# Patient Record
Sex: Male | Born: 1955 | Race: White | Hispanic: No | Marital: Married | Smoking: Never smoker
Health system: Southern US, Community
[De-identification: ages and names within clinical notes are randomized; demographics above are authoritative.]

## PROBLEM LIST (undated history)

## (undated) DIAGNOSIS — I10 Essential (primary) hypertension: Secondary | ICD-10-CM

## (undated) DIAGNOSIS — E039 Hypothyroidism, unspecified: Secondary | ICD-10-CM

## (undated) DIAGNOSIS — E785 Hyperlipidemia, unspecified: Secondary | ICD-10-CM

## (undated) HISTORY — PX: KNEE SURGERY: SHX244

## (undated) HISTORY — PX: CARPAL TUNNEL RELEASE: SHX101

## (undated) HISTORY — DX: Hyperlipidemia, unspecified: E78.5

## (undated) HISTORY — PX: ROTATOR CUFF REPAIR: SHX139

## (undated) HISTORY — DX: Hypothyroidism, unspecified: E03.9

## (undated) HISTORY — PX: ACHILLES TENDON LENGTHENING: SUR826

## (undated) HISTORY — PX: APPENDECTOMY: SHX54

## (undated) HISTORY — PX: HERNIA REPAIR: SHX51

## (undated) HISTORY — PX: HAND TENDON SURGERY: SHX663

---

## 2018-07-08 DIAGNOSIS — I10 Essential (primary) hypertension: Secondary | ICD-10-CM | POA: Insufficient documentation

## 2018-07-08 DIAGNOSIS — E7800 Pure hypercholesterolemia, unspecified: Secondary | ICD-10-CM | POA: Insufficient documentation

## 2019-11-26 DIAGNOSIS — U071 COVID-19: Secondary | ICD-10-CM

## 2019-11-26 HISTORY — DX: COVID-19: U07.1

## 2020-01-27 ENCOUNTER — Ambulatory Visit
Admission: EM | Admit: 2020-01-27 | Discharge: 2020-01-27 | Disposition: A | Discharge status: Home / Self Care | Payer: Medicare Other | Attending: Chiropractor | Admitting: Chiropractor

## 2020-01-27 ENCOUNTER — Ambulatory Visit (INDEPENDENT_AMBULATORY_CARE_PROVIDER_SITE_OTHER)
Admit: 2020-01-27 | Discharge: 2020-01-27 | Disposition: A | Discharge status: Home / Self Care | Payer: Medicare Other | Attending: Family Medicine | Admitting: Family Medicine

## 2020-01-27 ENCOUNTER — Other Ambulatory Visit: Payer: Self-pay

## 2020-01-27 ENCOUNTER — Encounter: Payer: Self-pay | Admitting: Emergency Medicine

## 2020-01-27 ENCOUNTER — Ambulatory Visit
Admission: RE | Admit: 2020-01-27 | Discharge: 2020-01-27 | Disposition: A | Discharge status: Home / Self Care | Payer: Medicare Other | Source: Ambulatory Visit | Attending: Family Medicine | Admitting: Family Medicine

## 2020-01-27 ENCOUNTER — Ambulatory Visit
Admission: EM | Admit: 2020-01-27 | Discharge: 2020-01-27 | Disposition: A | Discharge status: Home / Self Care | Payer: Medicare Other | Attending: Family Medicine | Admitting: Family Medicine

## 2020-01-27 DIAGNOSIS — E782 Mixed hyperlipidemia: Secondary | ICD-10-CM | POA: Diagnosis not present

## 2020-01-27 DIAGNOSIS — R200 Anesthesia of skin: Secondary | ICD-10-CM

## 2020-01-27 DIAGNOSIS — I10 Essential (primary) hypertension: Secondary | ICD-10-CM | POA: Diagnosis not present

## 2020-01-27 DIAGNOSIS — R202 Paresthesia of skin: Secondary | ICD-10-CM | POA: Diagnosis not present

## 2020-01-27 HISTORY — DX: Essential (primary) hypertension: I10

## 2020-01-27 LAB — CBC WITH DIFFERENTIAL/PLATELET
Abs Immature Granulocytes: 0.02 10*3/uL (ref 0.00–0.07)
Basophils Absolute: 0 10*3/uL (ref 0.0–0.1)
Basophils Relative: 1 %
Eosinophils Absolute: 0.2 10*3/uL (ref 0.0–0.5)
Eosinophils Relative: 3 %
HCT: 41.7 % (ref 39.0–52.0)
Hemoglobin: 14.7 g/dL (ref 13.0–17.0)
Immature Granulocytes: 0 %
Lymphocytes Relative: 34 %
Lymphs Abs: 2.3 10*3/uL (ref 0.7–4.0)
MCH: 32.7 pg (ref 26.0–34.0)
MCHC: 35.3 g/dL (ref 30.0–36.0)
MCV: 92.9 fL (ref 80.0–100.0)
Monocytes Absolute: 0.6 10*3/uL (ref 0.1–1.0)
Monocytes Relative: 9 %
Neutro Abs: 3.6 10*3/uL (ref 1.7–7.7)
Neutrophils Relative %: 53 %
Platelets: 260 10*3/uL (ref 150–400)
RBC: 4.49 MIL/uL (ref 4.22–5.81)
RDW: 12.4 % (ref 11.5–15.5)
WBC: 6.7 10*3/uL (ref 4.0–10.5)
nRBC: 0 % (ref 0.0–0.2)

## 2020-01-27 LAB — COMPREHENSIVE METABOLIC PANEL
ALT: 39 U/L (ref 0–44)
AST: 26 U/L (ref 15–41)
Albumin: 4.6 g/dL (ref 3.5–5.0)
Alkaline Phosphatase: 45 U/L (ref 38–126)
Anion gap: 12 (ref 5–15)
BUN: 23 mg/dL (ref 8–23)
CO2: 23 mmol/L (ref 22–32)
Calcium: 9.4 mg/dL (ref 8.9–10.3)
Chloride: 101 mmol/L (ref 98–111)
Creatinine, Ser: 0.92 mg/dL (ref 0.61–1.24)
GFR calc Af Amer: >60 mL/min (ref >60)
GFR calc non Af Amer: >60 mL/min (ref >60)
Glucose, Bld: 96 mg/dL (ref 70–99)
Potassium: 3.8 mmol/L (ref 3.5–5.1)
Sodium: 136 mmol/L (ref 135–145)
Total Bilirubin: 0.5 mg/dL (ref 0.3–1.2)
Total Protein: 8.3 g/dL — ABNORMAL HIGH (ref 6.5–8.1)

## 2020-01-27 LAB — LIPID PANEL
Cholesterol: 356 mg/dL — ABNORMAL HIGH (ref 0–200)
HDL: 38 mg/dL — ABNORMAL LOW (ref >40)
LDL Cholesterol: UNDETERMINED mg/dL (ref 0–99)
Total CHOL/HDL Ratio: 9.4 RATIO
Triglycerides: 535 mg/dL — ABNORMAL HIGH (ref <150)
VLDL: UNDETERMINED mg/dL (ref 0–40)

## 2020-01-27 MED ORDER — GEMFIBROZIL 600 MG PO TABS
600.0000 mg | ORAL_TABLET | Freq: Two times a day (BID) | ORAL | 1 refills | Status: DC
Start: 2020-01-27 — End: 2021-07-10

## 2020-01-27 MED ORDER — LISINOPRIL 20 MG PO TABS
20.0000 mg | ORAL_TABLET | Freq: Every day | ORAL | 1 refills | Status: DC
Start: 2020-01-27 — End: 2020-12-05

## 2020-01-27 MED ORDER — ASPIRIN EC 81 MG PO TBEC: 81.0000 mg | DELAYED_RELEASE_TABLET | Freq: Every day | ORAL | 3 refills | Status: DC

## 2020-01-27 MED ORDER — GADOBUTROL 1 MMOL/ML IV SOLN
8.0000 mL | Freq: Once | INTRAVENOUS | Status: AC | PRN
  Administered 2020-01-27: 8 mL via INTRAVENOUS

## 2020-01-27 MED ORDER — ATORVASTATIN CALCIUM 40 MG PO TABS
40.0000 mg | ORAL_TABLET | Freq: Every day | ORAL | 1 refills | Status: DC
Start: 2020-01-27 — End: 2020-12-09

## 2020-01-27 NOTE — ED Triage Notes (Signed)
Patient states he had hand surgery 3 weeks ago. He is c/o intermittent left arm numbness, face numbness and left leg numbness that started about 4 days ago. He was seen and evaluated at urgent care in [place] and was advised to go to the ER. He stated he did not want to wait in the ER so he came here.

## 2020-01-27 NOTE — ED Provider Notes (Addendum)
[facility]    CSN: [id number] Arrival date & time: 01/27/20  1412      History   Chief Complaint Chief Complaint  Patient presents with  . Numbness   HPI  64 year old male presents with the above complaint.  Patient reports that he has been experiencing intermittent numbness of the left side of his face, left arm, and left leg for the past 3 to 4 da ys.  Patient thought that this may be due to nerve block from recent surgery for the appearance contracture.  He saw his orthopedist today and was informed that this is not the case.  He denies any weakness.  No difficulty with speech.  No word finding difficulty.  No vision changes.  No headache.  No other reported symptoms.  No relieving factors.  Endorses compliance with his home medications.    Past Medical History:  Diagnosis Date  . COVID-19 11/2019  . Hypertension    Past Surgical History:  Procedure Laterality Date  . ACHILLES TENDON LENGTHENING    . HAND TENDON SURGERY    . HERNIA REPAIR    . KNEE SURGERY    . ROTATOR CUFF REPAIR     Home Medications    Prior to Admission medications   Medication Sig Start Date End Date Taking? Authorizing Provider  aspirin EC 81 MG tablet Take 1 tablet (81 mg total) by mouth daily. Swallow whole. 01/27/20   Tommie Sams, DO  atorvastatin (LIPITOR) 40 MG tablet Take 1 tablet (40 mg total) by mouth daily. 01/27/20   Tommie Sams, DO  gemfibrozil (LOPID) 600 MG tablet Take 1 tablet (600 mg total) by mouth 2 (two) times daily before a meal. 01/27/20   Cassius Cullinane G, DO  lisinopril (ZESTRIL) 20 MG tablet Take 1 tablet (20 mg total) by mouth daily. 01/27/20   Tommie Sams, DO   Social History Social History   Tobacco Use  . Smoking status: Never Smoker  . Smokeless tobacco: Never Used  Substance Use Topics  . Alcohol use: Yes  . Drug use: Never     Allergies   Patient has no known allergies.   Review of Systems Review of Systems  Eyes: Negative.   Neurological:  Positive for numbness. Negative for weakness.   Physical Exam Triage Vital Signs ED Triage Vitals  Enc Vitals Group     BP 01/27/20 1430 (!) 169/126     Pulse Rate 01/27/20 1430 79     Resp 01/27/20 1430 18     Temp 01/27/20 1430 98 F (36.7 C)     Temp Source 01/27/20 1430 Oral     SpO2 01/27/20 1430 98 %     Weight 01/27/20 1427 195 lb (88.5 kg)     Height 01/27/20 1427 5\' 8"  (1.727 m)     Head Circumference --      Peak Flow --      Pain Score 01/27/20 1427 0     Pain Loc --      Pain Edu? --      Excl. in GC? --    Updated Vital Signs BP (!) 169/126 (BP Location: Right Arm)   Pulse 79   Temp 98 F (36.7 C) (Oral)   Resp 18   Ht 5\' 8"  (1.727 m)   Wt 88.5 kg   SpO2 98%   BMI 29.65 kg/m   Visual Acuity Right Eye Distance:   Left Eye Distance:   Bilateral Distance:  Right Eye Near:   Left Eye Near:    Bilateral Near:     Physical Exam Vitals and nursing note reviewed.  Constitutional:      General: He is not in acute distress.    Appearance: Normal appearance. He is not ill-appearing.  HENT:     Head: Normocephalic and atraumatic.  Eyes:     General:        Right eye: No discharge.        Left eye: No discharge.     Conjunctiva/sclera: Conjunctivae normal.  Cardiovascular:     Rate and Rhythm: Normal rate and regular rhythm.     Heart sounds: No murmur heard.   Pulmonary:     Effort: Pulmonary effort is normal.     Breath sounds: Normal breath sounds. No wheezing, rhonchi or rales.  Neurological:     General: No focal deficit present.     Mental Status: He is alert and oriented to person, place, and time.     Cranial Nerves: No cranial nerve deficit.     Motor: No weakness.  Psychiatric:        Mood and Affect: Mood normal.        Behavior: Behavior normal.    UC Treatments / Results  Labs (all labs ordered are listed, but only abnormal results are displayed) Labs Reviewed  COMPREHENSIVE METABOLIC PANEL - Abnormal; Notable for the  following components:      Result Value   Total Protein 8.3 (*)    All other components within normal limits  CBC WITH DIFFERENTIAL/PLATELET  LIPID PANEL    EKG   Radiology CT Head Wo Contrast  Result Date: 01/27/2020 CLINICAL DATA:  64 year old who presents with intermittent LEFT UPPER EXTREMITY numbness, facial numbness and LEFT lower extremity numbness that began about 4 days ago. EXAM: CT HEAD WITHOUT CONTRAST TECHNIQUE: Contiguous axial images were obtained from the base of the skull through the vertex without intravenous contrast. COMPARISON:  None. FINDINGS: Brain: Slight head tilt in the gantry. Ventricular system normal in size and appearance for age. 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 No mass lesion. No midline shift. No acute hemorrhage or hematoma. No extra-axial fluid collections. No evidence of acute infarction. Vascular: Moderate BILATERAL carotid siphon and mild BILATERAL vertebral artery atherosclerosis. No hyperdense vessel. Skull: No skull fracture or other focal osseous abnormality involving the skull. Sinuses/Orbits: Visualized paranasal sinuses, bilateral mastoid air cells and bilateral middle ear cavities well-aerated. Visualized orbits and globes normal in appearance. Other: None. IMPRESSION: 1. No acute intracranial abnormality. 2. 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 Electronically Signed   By: Hulan Saas M.D.   On: 01/27/2020 15:32    Procedures Procedures (including critical care time)  Medications Ordered in UC Medications - No data to display  Initial Impression / Assessment and Plan / UC Course  I have reviewed the triage vital signs and the nursing notes.  Pertinent labs & imaging results that were available during my care of the patient were reviewed by me and considered in my medical decision making (see chart for details).    64 year old male presents with  left-sided numbness. Laboratory studies unremarkable. CT head was obtained and revealed no acute intracranial abnormalities. Patient was placed on aspirin. Sending for MRI for further evaluation.  Hypertension -currently uncontrolled. Patient states that this often happens due to whitecoat hypertension. Lisinopril refilled.  Hyperlipidemia -mixed. Unsure of control. I  have refilled atorvastatin and Lopid today. Awaiting a lipid panel.  Final Clinical Impressions(s) / UC Diagnoses   Final diagnoses:  Numbness  Essential hypertension  Mixed hyperlipidemia     Discharge Instructions     Labs normal.  No evidence of acute stroke on CT.  Medications as directed.     ED Prescriptions    Medication Sig Dispense Auth. Provider   aspirin EC 81 MG tablet Take 1 tablet (81 mg total) by mouth daily. Swallow whole. 90 tablet Jeromy Borcherding G, DO   atorvastatin (LIPITOR) 40 MG tablet Take 1 tablet (40 mg total) by mouth daily. 90 tablet Gyanna Jarema G, DO   gemfibrozil (LOPID) 600 MG tablet Take 1 tablet (600 mg total) by mouth 2 (two) times daily before a meal. 180 tablet Aprill Banko G, DO   lisinopril (ZESTRIL) 20 MG tablet Take 1 tablet (20 mg total) by mouth daily. 90 tablet Everlene Other G, DO     PDMP not reviewed this encounter.      Tommie Sams, [place] 01/27/20 1612

## 2020-01-27 NOTE — Discharge Instructions (Addendum)
I would recommend that you follow up in the ER for further evaluation and treatment  Possible CT and labs to help rule out TIA or stroke  Your blood pressure is elevated in the office today

## 2020-01-27 NOTE — ED Triage Notes (Signed)
Pt presents with numbness and tingling in the left side of the face, left arm and left leg for the past 3 days.Denies chest pain, dizziness, vision changes, trouble talking.   Pt reports he had surgery in the left hand 3 weeks ago, and he was thinking the numbness and tingling an be related to the anesthesia block. Pt went to the orthopedic surgeon today and they said is not related to the blockage.

## 2020-01-27 NOTE — ED Provider Notes (Signed)
Brad Flores    CSN: [id number] Arrival date & time: 01/27/20  1209      History   Chief Complaint Chief Complaint  Patient presents with  . Numbness    HPI Brad Flores is a 64 y.o. male.   Reports that he has been experiencing numbness and tingling of the L arm, L leg, and left side of his face. Reports that he feels all of the sensations together when they occur. Reports that this has been occurring intermittently for the last 3 days. Reports that he had surgery on his L hand about 3 weeks ago. Denies back pain, neck pain, arm pain, leg pain. He came directly from the surgeon's office and reports that he was told to be evaluated for his numbness. Denies headaches, weakness, fall, vision changes, ringing in the ears, changes in taste, other symptoms.   ROS per HPI  The history is provided by the patient.    Past Medical History:  Diagnosis Date  . Hypertension     There are no problems to display for this patient.   Past Surgical History:  Procedure Laterality Date  . ACHILLES TENDON LENGTHENING    . HAND TENDON SURGERY    . HERNIA REPAIR    . KNEE SURGERY    . ROTATOR CUFF REPAIR         Home Medications    Prior to Admission medications   Medication Sig Start Date End Date Taking? Authorizing Provider  atorvastatin (LIPITOR) 40 MG tablet Take by mouth. 12/31/17  Yes [provider]  gemfibrozil (LOPID) 600 MG tablet Take by mouth. 12/31/17  Yes [provider]  lisinopril (ZESTRIL) 20 MG tablet Take 1 tablet by mouth daily. 12/31/17  Yes [provider]    Family History History reviewed. No pertinent family history.  Social History Social History   Tobacco Use  . Smoking status: Never Smoker  . Smokeless tobacco: Never Used  Substance Use Topics  . Alcohol use: Yes  . Drug use: Never     Allergies   Patient has no known allergies.   Review of Systems Review of Systems   Physical Exam Triage Vital  Signs ED Triage Vitals  Enc Vitals Group     BP      Pulse      Resp      Temp      Temp src      SpO2      Weight      Height      Head Circumference      Peak Flow      Pain Score      Pain Loc      Pain Edu?      Excl. in GC?    No data found.  Updated Vital Signs BP (!) 156/106 (BP Location: Right Arm)   Pulse 70   Temp 98.8 F (37.1 C) (Oral)   Resp 18   SpO2 95%      Physical Exam Vitals and nursing note reviewed.  Constitutional:      General: He is not in acute distress.    Appearance: He is well-developed. He is not ill-appearing.  HENT:     Head: Normocephalic and atraumatic.  Eyes:     Extraocular Movements: Extraocular movements intact.     Conjunctiva/sclera: Conjunctivae normal.     Pupils: Pupils are equal, round, and reactive to light.  Cardiovascular:     Rate and Rhythm: Normal  rate and regular rhythm.     Heart sounds: Normal heart sounds. No murmur heard.   Pulmonary:     Effort: Pulmonary effort is normal. No respiratory distress.     Breath sounds: Normal breath sounds. No stridor. No wheezing, rhonchi or rales.  Chest:     Chest wall: No tenderness.  Abdominal:     Palpations: Abdomen is soft.     Tenderness: There is no abdominal tenderness.  Musculoskeletal:        General: Normal range of motion.     Cervical back: Normal range of motion and neck supple.  Skin:    General: Skin is warm and dry.     Capillary Refill: Capillary refill takes less than 2 seconds.     Comments: Sutures to the palm of the L hand from middle finger to wrist  Neurological:     General: No focal deficit present.     Mental Status: He is alert and oriented to person, place, and time.     Cranial Nerves: No cranial nerve deficit.     Sensory: No sensory deficit.     Motor: No weakness.     Coordination: Coordination normal.     Gait: Gait normal.     Deep Tendon Reflexes: Reflexes normal.  Psychiatric:        Mood and Affect: Mood normal.         Behavior: Behavior normal.        Thought Content: Thought content normal.      UC Treatments / Results  Labs (all labs ordered are listed, but only abnormal results are displayed) Labs Reviewed - No data to display  EKG ED ECG REPORT   Date: 01/27/2020  EKG Time: 1:33 PM  Rate: 72  Rhythm: normal sinus rhythm,  normal EKG, normal sinus rhythm, there are no previous tracings available for comparison  Axis:   Intervals:none  ST&T Change: none  Narrative Interpretation: I agree with interpretation, NSR, normal EKG      Radiology No results found.  Procedures Procedures (including critical care time)  Medications Ordered in UC Medications - No data to display  Initial Impression / Assessment and Plan / UC Course  I have reviewed the triage vital signs and the nursing notes.  Pertinent labs & imaging results that were available during my care of the patient were reviewed by me and considered in my medical decision making (see chart for details).     Numbness and tingling to L arm, leg and face  Discussed with pt that symptoms were likely not related to surgery or the nerve block that he received prior to surgery. Discussed that symptoms could be caused by a blood clot or stroke Given that he has recently had surgery, he is at a higher risk for stroke than he normally would be  Discussed that we cannot rule out stroke or blood clot here in the office No neuro deficits noted in office today Recommended that patient go to the ER for further evaluation and treatment Discussed that he did not want to sit in the ER for hours and hours for a CT Gave info to [facility] for possible evaluation there Patient agrees to go here for further evaluation and treatment in personal vehicle Final Clinical Impressions(s) / UC Diagnoses   Final diagnoses:  Numbness and tingling in left arm  Numbness and tingling of left leg  Numbness and tingling of left side of face     Discharge  Instructions     I would recommend that you follow up in the ER for further evaluation and treatment  Possible CT and labs to help rule out TIA or stroke  Your blood pressure is elevated in the office today      ED Prescriptions    None     PDMP not reviewed this encounter.   Moshe Cipro, NP 01/27/20 1333

## 2020-01-27 NOTE — Discharge Instructions (Addendum)
Labs normal.  No evidence of acute stroke on CT.  Medications as directed.

## 2020-01-28 LAB — LDL CHOLESTEROL, DIRECT: Direct LDL: 206.2 mg/dL — ABNORMAL HIGH (ref 0–99)

## 2020-07-22 ENCOUNTER — Other Ambulatory Visit: Payer: Self-pay | Admitting: Family Medicine

## 2020-12-02 NOTE — Progress Notes (Addendum)
BP (!) 163/101   Pulse 69   Temp 98.2 F (36.8 C)   Ht 5' 7.68" (1.719 m)   Wt 199 lb 2 oz (90.3 kg)   SpO2 99%   BMI 30.57 kg/m    Subjective:    Patient ID: Brad Flores, male    DOB: 1955/10/11, 65 y.o.   MRN: [id number]  HPI: Brad Flores is a 65 y.o. male  Chief Complaint  Patient presents with   Establish Care   Labs Only   Hypertension   Patient presents to clinic to establish care with new PCP.  Patient reports a history of HTN, HLD.  Patient has not been taking his cholesterol medications because he gets muscle spasms.  States he thinks it is the gemfibrozil.   Patient denies a history of: Thyroid problems, Diabetes, Depression, Anxiety, Neurological problems, and Abdominal problems.   HYPERTENSION / HYPERLIPIDEMIA Satisfied with current treatment? yes Duration of hypertension: years BP monitoring frequency: daily BP range: mid 130/78- Patient states it is always higher at the doctors office. BP medication side effects: no Past BP meds: lisinopril Duration of hyperlipidemia: years Cholesterol medication side effects: yes Cholesterol supplements:  beet extract Past cholesterol medications: atorvastain (lipitor) and gemfibrozil Medication compliance: excellent compliance Aspirin: yes Recent stressors: no Recurrent headaches: no Visual changes: no Palpitations: no Dyspnea: no Chest pain: no Lower extremity edema: no Dizzy/lightheaded: no Patient states that he does over indulge on food and alcohol at times and notices his blood pressure increasing.  Patient would like to have his testosterone checked.  States he doesn't have the same energy as he used to have and its getting some belly fast.    Active Ambulatory Problems    Diagnosis Date Noted   Hypercholesterolemia 07/08/2018   Essential hypertension 07/08/2018   Resolved Ambulatory Problems    Diagnosis Date Noted   No Resolved Ambulatory Problems   Past Medical History:   Diagnosis Date   COVID-19 11/2019   Hyperlipidemia    Hypertension    Past Surgical History:  Procedure Laterality Date   ACHILLES TENDON LENGTHENING     CARPAL TUNNEL RELEASE Right    HAND TENDON SURGERY     HERNIA REPAIR     KNEE SURGERY     ROTATOR CUFF REPAIR     Family History  Problem Relation Age of Onset   Heart disease Mother    Heart disease Father     Allergies and medications reviewed and updated.  Review of Systems  Constitutional:  Positive for fatigue.  Eyes:  Negative for visual disturbance.  Respiratory:  Negative for shortness of breath.   Cardiovascular:  Negative for chest pain and leg swelling.  Neurological:  Negative for light-headedness and headaches.   Per HPI unless specifically indicated above     Objective:    BP (!) 163/101   Pulse 69   Temp 98.2 F (36.8 C)   Ht 5' 7.68" (1.719 m)   Wt 199 lb 2 oz (90.3 kg)   SpO2 99%   BMI 30.57 kg/m   Wt Readings from Last 3 Encounters:  12/05/20 199 lb 2 oz (90.3 kg)  01/27/20 195 lb (88.5 kg)    Physical Exam Vitals and nursing note reviewed.  Constitutional:      General: He is not in acute distress.    Appearance: Normal appearance. He is not ill-appearing, toxic-appearing or diaphoretic.  HENT:     Head: Normocephalic.     Right Ear: External  ear normal.     Left Ear: External ear normal.     Nose: Nose normal. No congestion or rhinorrhea.     Mouth/Throat:     Mouth: Mucous membranes are moist.  Eyes:     General:        Right eye: No discharge.        Left eye: No discharge.     Extraocular Movements: Extraocular movements intact.     Conjunctiva/sclera: Conjunctivae normal.     Pupils: Pupils are equal, round, and reactive to light.  Cardiovascular:     Rate and Rhythm: Normal rate and regular rhythm.     Heart sounds: No murmur heard. Pulmonary:     Effort: Pulmonary effort is normal. No respiratory distress.     Breath sounds: Normal breath sounds. No wheezing, rhonchi  or rales.  Abdominal:     General: Abdomen is flat. Bowel sounds are normal.  Musculoskeletal:     Cervical back: Normal range of motion and neck supple.  Skin:    General: Skin is warm and dry.     Capillary Refill: Capillary refill takes less than 2 seconds.  Neurological:     General: No focal deficit present.     Mental Status: He is alert and oriented to person, place, and time.  Psychiatric:        Mood and Affect: Mood normal.        Behavior: Behavior normal.        Thought Content: Thought content normal.        Judgment: Judgment normal.    Results for orders placed or performed during the hospital encounter of 01/27/20  Comprehensive metabolic panel  Result Value Ref Range   Sodium 136 135 - 145 mmol/L   Potassium 3.8 3.5 - 5.1 mmol/L   Chloride 101 98 - 111 mmol/L   CO2 23 22 - 32 mmol/L   Glucose, Bld 96 70 - 99 mg/dL   BUN 23 8 - 23 mg/dL   Creatinine, Ser 1.51 0.61 - 1.24 mg/dL   Calcium 9.4 8.9 - 76.1 mg/dL   Total Protein 8.3 (H) 6.5 - 8.1 g/dL   Albumin 4.6 3.5 - 5.0 g/dL   AST 26 15 - 41 U/L   ALT 39 0 - 44 U/L   Alkaline Phosphatase 45 38 - 126 U/L   Total Bilirubin 0.5 0.3 - 1.2 mg/dL   GFR calc non Af Amer >60 >60 mL/min   GFR calc Af Amer >60 >60 mL/min   Anion gap 12 5 - 15  CBC with Differential  Result Value Ref Range   WBC 6.7 4.0 - 10.5 K/uL   RBC 4.49 4.22 - 5.81 MIL/uL   Hemoglobin 14.7 13.0 - 17.0 g/dL   HCT 60.7 37.1 - 06.2 %   MCV 92.9 80.0 - 100.0 fL   MCH 32.7 26.0 - 34.0 pg   MCHC 35.3 30.0 - 36.0 g/dL   RDW 69.4 85.4 - 62.7 %   Platelets 260 150 - 400 K/uL   nRBC 0.0 0.0 - 0.2 %   Neutrophils Relative % 53 %   Neutro Abs 3.6 1.7 - 7.7 K/uL   Lymphocytes Relative 34 %   Lymphs Abs 2.3 0.7 - 4.0 K/uL   Monocytes Relative 9 %   Monocytes Absolute 0.6 0.1 - 1.0 K/uL   Eosinophils Relative 3 %   Eosinophils Absolute 0.2 0.0 - 0.5 K/uL   Basophils Relative 1 %   Basophils Absolute  0.0 0.0 - 0.1 K/uL   Immature Granulocytes 0 %    Abs Immature Granulocytes 0.02 0.00 - 0.07 K/uL  Lipid panel  Result Value Ref Range   Cholesterol 356 (H) 0 - 200 mg/dL   Triglycerides 889 (H) <150 mg/dL   HDL 38 (L) >16 mg/dL   Total CHOL/HDL Ratio 9.4 RATIO   VLDL UNABLE TO CALCULATE IF TRIGLYCERIDE OVER 400 mg/dL 0 - 40 mg/dL   LDL Cholesterol UNABLE TO CALCULATE IF TRIGLYCERIDE OVER 400 mg/dL 0 - 99 mg/dL  LDL cholesterol, direct  Result Value Ref Range   Direct LDL 206.2 (H) 0 - 99 mg/dL      Assessment & Plan:   Problem List Items Addressed This Visit       Cardiovascular and Mediastinum   Essential hypertension - Primary    Chronic.  Elevated at home and in office. Increased Lisinopril to 40mg  daily to help with blood pressure control. Labs ordered today.  Follow up in 1 month.  Call sooner if concerns arise.        Relevant Medications   lisinopril (ZESTRIL) 40 MG tablet   Other Relevant Orders   Comprehensive metabolic panel   Lipid Profile     Other   Hypercholesterolemia    Chronic. Labs ordered today.  Recommend restarting atorvastatin. Can discuss alternative medications to gembofibrozil once lab work has returned.  Patient agrees.        Relevant Medications   lisinopril (ZESTRIL) 40 MG tablet   Other Relevant Orders   Comprehensive metabolic panel   Lipid Profile   Other Visit Diagnoses     Prediabetes       Labs ordered today. Will make recommendations based on lab results.    Relevant Orders   HgB A1c   Fatigue, unspecified type       Patient would like to have testosterone drawn. Will make recommendatins based on lab results.    Relevant Orders   Testosterone   TSH   CBC w/Diff   Encounter to establish care       COVID       Patient requested COVID AB.    Relevant Orders   SAR CoV2 Serology (COVID 19)AB(IGG)IA   Benign prostatic hyperplasia, unspecified whether lower urinary tract symptoms present       Relevant Orders   PSA        Follow up plan: Return in about 1 month  (around 01/05/2021) for BP Check.

## 2020-12-05 ENCOUNTER — Other Ambulatory Visit: Payer: Self-pay

## 2020-12-05 ENCOUNTER — Ambulatory Visit (INDEPENDENT_AMBULATORY_CARE_PROVIDER_SITE_OTHER): Payer: Medicare Other | Admitting: Nurse Practitioner

## 2020-12-05 ENCOUNTER — Encounter: Payer: Self-pay | Admitting: Nurse Practitioner

## 2020-12-05 VITALS — BP 163/101 | HR 69 | Temp 98.2°F | Ht 67.68 in | Wt 199.1 lb

## 2020-12-05 DIAGNOSIS — R7303 Prediabetes: Secondary | ICD-10-CM | POA: Diagnosis not present

## 2020-12-05 DIAGNOSIS — E7800 Pure hypercholesterolemia, unspecified: Secondary | ICD-10-CM | POA: Diagnosis not present

## 2020-12-05 DIAGNOSIS — I10 Essential (primary) hypertension: Secondary | ICD-10-CM | POA: Diagnosis not present

## 2020-12-05 DIAGNOSIS — R5383 Other fatigue: Secondary | ICD-10-CM

## 2020-12-05 DIAGNOSIS — Z7689 Persons encountering health services in other specified circumstances: Secondary | ICD-10-CM

## 2020-12-05 DIAGNOSIS — U071 COVID-19: Secondary | ICD-10-CM

## 2020-12-05 DIAGNOSIS — N400 Enlarged prostate without lower urinary tract symptoms: Secondary | ICD-10-CM

## 2020-12-05 MED ORDER — LISINOPRIL 40 MG PO TABS: 40.0000 mg | ORAL_TABLET | Freq: Every day | ORAL | 1 refills | Status: DC

## 2020-12-05 NOTE — Assessment & Plan Note (Signed)
Chronic.  Elevated at home and in office. Increased Lisinopril to 40mg  daily to help with blood pressure control. Labs ordered today.  Follow up in 1 month.  Call sooner if concerns arise.

## 2020-12-05 NOTE — Assessment & Plan Note (Signed)
Chronic. Labs ordered today.  Recommend restarting atorvastatin. Can discuss alternative medications to gembofibrozil once lab work has returned.  Patient agrees.

## 2020-12-06 LAB — CBC WITH DIFFERENTIAL/PLATELET
Basophils Absolute: 0.1 10*3/uL (ref 0.0–0.2)
Basos: 1 %
EOS (ABSOLUTE): 0.1 10*3/uL (ref 0.0–0.4)
Eos: 3 %
Hematocrit: 43.5 % (ref 37.5–51.0)
Hemoglobin: 14.9 g/dL (ref 13.0–17.7)
Immature Grans (Abs): 0 10*3/uL (ref 0.0–0.1)
Immature Granulocytes: 0 %
Lymphocytes Absolute: 1.6 10*3/uL (ref 0.7–3.1)
Lymphs: 34 %
MCH: 32.1 pg (ref 26.6–33.0)
MCHC: 34.3 g/dL (ref 31.5–35.7)
MCV: 94 fL (ref 79–97)
Monocytes Absolute: 0.4 10*3/uL (ref 0.1–0.9)
Monocytes: 10 %
Neutrophils Absolute: 2.5 10*3/uL (ref 1.4–7.0)
Neutrophils: 52 %
Platelets: 281 10*3/uL (ref 150–450)
RBC: 4.64 x10E6/uL (ref 4.14–5.80)
RDW: 12.5 % (ref 11.6–15.4)
WBC: 4.6 10*3/uL (ref 3.4–10.8)

## 2020-12-06 LAB — SAR COV2 SEROLOGY (COVID19)AB(IGG),IA
SARS-CoV-2 Semi-Quant IgG Ab: 273 AU/mL (ref <13.0)
SARS-CoV-2 Spike Ab Interp: POSITIVE

## 2020-12-06 LAB — LIPID PANEL
Chol/HDL Ratio: 7.4 ratio — ABNORMAL HIGH (ref 0.0–5.0)
Cholesterol, Total: 305 mg/dL — ABNORMAL HIGH (ref 100–199)
HDL: 41 mg/dL (ref >39)
LDL Chol Calc (NIH): 230 mg/dL — ABNORMAL HIGH (ref 0–99)
Triglycerides: 174 mg/dL — ABNORMAL HIGH (ref 0–149)
VLDL Cholesterol Cal: 34 mg/dL (ref 5–40)

## 2020-12-06 LAB — TSH: TSH: 6.68 u[IU]/mL — ABNORMAL HIGH (ref 0.450–4.500)

## 2020-12-06 LAB — HEMOGLOBIN A1C
Est. average glucose Bld gHb Est-mCnc: 120 mg/dL
Hgb A1c MFr Bld: 5.8 % — ABNORMAL HIGH (ref 4.8–5.6)

## 2020-12-06 LAB — TESTOSTERONE: Testosterone: 281 ng/dL (ref 264–916)

## 2020-12-06 LAB — PSA: Prostate Specific Ag, Serum: 0.4 ng/mL (ref 0.0–4.0)

## 2020-12-06 NOTE — Progress Notes (Signed)
Please let patient know that his lab work shows that his cholesterol is elevated.  I calculated his cardiac risk score which is significantly elevated in the high range.  I recommend patient begin Crestor 5mg  once daily.  We will increase the dose to a goal of 20mg  as patient tolerates it.  If he agrees, I will send this to the pharmacy for him.  The 10-year ASCVD risk score DC ., et al., 2013) is: 33%   Values used to calculate the score:     Age: 26 years     Sex: Male     Is Non-Hispanic African American: No     Diabetic: No     Tobacco smoker: No     Systolic Blood Pressure: 163 mmHg     Is BP treated: Yes     HDL Cholesterol: 41 mg/dL     Total Cholesterol: 305 mg/dL  Please let patient know that his testosterone is within normal range.  However, his thyroid is elevated indicating that it is not functioning properly.  I would like to draw more labs to find out exactly what type of thyroid problem he has.  We can do them at his follow up or he can come in sooner to have them done.  His a1c also shows that he is prediabetic.  No medication recommendations at this time, I recommend he follow a low carb diet and exercise regularly.  Our goal is to keep this number below 6.  He is at 5.8 right now.   Patient's PSA is within normal limits.   He does still have COVID antibodies.

## 2020-12-09 MED ORDER — ATORVASTATIN CALCIUM 80 MG PO TABS: 80.0000 mg | ORAL_TABLET | Freq: Every day | ORAL | 3 refills | Status: AC

## 2020-12-09 NOTE — Addendum Note (Signed)
Addended by: Larae Grooms on: 12/09/2020 08:02 AM   Modules accepted: Orders

## 2020-12-09 NOTE — Progress Notes (Signed)
Patient is already on atorvastatin. I have increased the dose from 40mg  to 80mg . I have already sent it to the pharmacy.

## 2021-01-04 NOTE — Progress Notes (Signed)
BP 124/84   Pulse 66   Temp 98.3 F (36.8 C)   Wt 196 lb (88.9 kg)   SpO2 96%   BMI 30.09 kg/m    Subjective:    Patient ID: Brad Flores, male    DOB: Mar 08, 1956, 65 y.o.   MRN: [id number]  HPI: Brad Flores is a 65 y.o. male  Chief Complaint  Patient presents with   Hypertension   Labs Only    TSH and patient would like his liver checked    HYPERTENSION Hypertension status: controlled  Satisfied with current treatment? yes Duration of hypertension: years BP monitoring frequency:  daily BP range: 130/70 BP medication side effects:  no Medication compliance: excellent compliance Previous BP meds:lisinopril Aspirin: no Recurrent headaches: no Visual changes: no Palpitations: no Dyspnea: no Chest pain: no Lower extremity edema: no Dizzy/lightheaded: no  Relevant past medical, surgical, family and social history reviewed and updated as indicated. Interim medical history since our last visit reviewed. Allergies and medications reviewed and updated.  Review of Systems  Eyes:  Negative for visual disturbance.  Respiratory:  Negative for shortness of breath.   Cardiovascular:  Negative for chest pain and leg swelling.  Neurological:  Negative for light-headedness and headaches.   Per HPI unless specifically indicated above     Objective:    BP 124/84   Pulse 66   Temp 98.3 F (36.8 C)   Wt 196 lb (88.9 kg)   SpO2 96%   BMI 30.09 kg/m   Wt Readings from Last 3 Encounters:  01/05/21 196 lb (88.9 kg)  12/05/20 199 lb 2 oz (90.3 kg)  01/27/20 195 lb (88.5 kg)    Physical Exam Vitals and nursing note reviewed.  Constitutional:      General: He is not in acute distress.    Appearance: Normal appearance. He is not ill-appearing, toxic-appearing or diaphoretic.  HENT:     Head: Normocephalic.     Right Ear: External ear normal.     Left Ear: External ear normal.     Nose: Nose normal. No congestion or rhinorrhea.     Mouth/Throat:      Mouth: Mucous membranes are moist.  Eyes:     General:        Right eye: No discharge.        Left eye: No discharge.     Extraocular Movements: Extraocular movements intact.     Conjunctiva/sclera: Conjunctivae normal.     Pupils: Pupils are equal, round, and reactive to light.  Cardiovascular:     Rate and Rhythm: Normal rate and regular rhythm.     Heart sounds: No murmur heard. Pulmonary:     Effort: Pulmonary effort is normal. No respiratory distress.     Breath sounds: Normal breath sounds. No wheezing, rhonchi or rales.  Abdominal:     General: Abdomen is flat. Bowel sounds are normal.  Musculoskeletal:     Cervical back: Normal range of motion and neck supple.  Skin:    General: Skin is warm and dry.     Capillary Refill: Capillary refill takes less than 2 seconds.  Neurological:     General: No focal deficit present.     Mental Status: He is alert and oriented to person, place, and time.  Psychiatric:        Mood and Affect: Mood normal.        Behavior: Behavior normal.        Thought Content: Thought content normal.  Judgment: Judgment normal.    Results for orders placed or performed in visit on 12/05/20  Lipid Profile  Result Value Ref Range   Cholesterol, Total 305 (H) 100 - 199 mg/dL   Triglycerides 174 (H) 0 - 149 mg/dL   HDL 41 >39 mg/dL   VLDL Cholesterol Cal 34 5 - 40 mg/dL   LDL Chol Calc (NIH) 230 (H) 0 - 99 mg/dL   Chol/HDL Ratio 7.4 (H) 0.0 - 5.0 ratio  Testosterone  Result Value Ref Range   Testosterone 281 264 - 916 ng/dL  TSH  Result Value Ref Range   TSH 6.680 (H) 0.450 - 4.500 uIU/mL  HgB A1c  Result Value Ref Range   Hgb A1c MFr Bld 5.8 (H) 4.8 - 5.6 %   Est. average glucose Bld gHb Est-mCnc 120 mg/dL  CBC w/Diff  Result Value Ref Range   WBC 4.6 3.4 - 10.8 x10E3/uL   RBC 4.64 4.14 - 5.80 x10E6/uL   Hemoglobin 14.9 13.0 - 17.7 g/dL   Hematocrit 43.5 37.5 - 51.0 %   MCV 94 79 - 97 fL   MCH 32.1 26.6 - 33.0 pg   MCHC 34.3 31.5  - 35.7 g/dL   RDW 12.5 11.6 - 15.4 %   Platelets 281 150 - 450 x10E3/uL   Neutrophils 52 Not Estab. %   Lymphs 34 Not Estab. %   Monocytes 10 Not Estab. %   Eos 3 Not Estab. %   Basos 1 Not Estab. %   Neutrophils Absolute 2.5 1.4 - 7.0 x10E3/uL   Lymphocytes Absolute 1.6 0.7 - 3.1 x10E3/uL   Monocytes Absolute 0.4 0.1 - 0.9 x10E3/uL   EOS (ABSOLUTE) 0.1 0.0 - 0.4 x10E3/uL   Basophils Absolute 0.1 0.0 - 0.2 x10E3/uL   Immature Granulocytes 0 Not Estab. %   Immature Grans (Abs) 0.0 0.0 - 0.1 x10E3/uL  PSA  Result Value Ref Range   Prostate Specific Ag, Serum 0.4 0.0 - 4.0 ng/mL  SAR CoV2 Serology (COVID 19)AB(IGG)IA  Result Value Ref Range   SARS-CoV-2 Semi-Quant IgG Ab 273.0 Neg <13.0 AU/mL   SARS-CoV-2 Spike Ab Interp Positive       Assessment & Plan:   Problem List Items Addressed This Visit       Cardiovascular and Mediastinum   Essential hypertension - Primary    Chronic.  Controlled.  Elevated at first check, improved on second check.  Consistently in normal range at home. Continue with current medication regimen of Lisinopril 58m.  Labs ordered today.  Return to clinic in 6 months for reevaluation.  Call sooner if concerns arise.        Relevant Orders   Comp Met (CMET)   Other Visit Diagnoses     Abnormal TSH       Labs ordered today.  Previous TSH was elevated.  Will make recommendations based on lab results.    Relevant Orders   Thyroid Panel With TSH        Follow up plan: Return in about 6 months (around 07/08/2021) for HTN, HLD, DM2 FU.

## 2021-01-05 ENCOUNTER — Encounter: Payer: Self-pay | Admitting: Nurse Practitioner

## 2021-01-05 ENCOUNTER — Ambulatory Visit (INDEPENDENT_AMBULATORY_CARE_PROVIDER_SITE_OTHER): Payer: Medicare Other | Admitting: Nurse Practitioner

## 2021-01-05 ENCOUNTER — Other Ambulatory Visit: Payer: Self-pay

## 2021-01-05 VITALS — BP 124/84 | HR 66 | Temp 98.3°F | Wt 196.0 lb

## 2021-01-05 DIAGNOSIS — E038 Other specified hypothyroidism: Secondary | ICD-10-CM

## 2021-01-05 DIAGNOSIS — I10 Essential (primary) hypertension: Secondary | ICD-10-CM | POA: Diagnosis not present

## 2021-01-05 DIAGNOSIS — R7989 Other specified abnormal findings of blood chemistry: Secondary | ICD-10-CM | POA: Diagnosis not present

## 2021-01-05 NOTE — Assessment & Plan Note (Signed)
Chronic.  Controlled.  Elevated at first check, improved on second check.  Consistently in normal range at home. Continue with current medication regimen of Lisinopril 40mg .  Labs ordered today.  Return to clinic in 6 months for reevaluation.  Call sooner if concerns arise.

## 2021-01-06 LAB — COMPREHENSIVE METABOLIC PANEL
ALT: 36 IU/L (ref 0–44)
AST: 29 IU/L (ref 0–40)
Albumin/Globulin Ratio: 1.4 (ref 1.2–2.2)
Albumin: 4.3 g/dL (ref 3.8–4.8)
Alkaline Phosphatase: 59 IU/L (ref 44–121)
BUN/Creatinine Ratio: 21 (ref 10–24)
BUN: 20 mg/dL (ref 8–27)
Bilirubin Total: 0.4 mg/dL (ref 0.0–1.2)
CO2: 22 mmol/L (ref 20–29)
Calcium: 9.5 mg/dL (ref 8.6–10.2)
Chloride: 99 mmol/L (ref 96–106)
Creatinine, Ser: 0.96 mg/dL (ref 0.76–1.27)
Globulin, Total: 3 g/dL (ref 1.5–4.5)
Glucose: 94 mg/dL (ref 65–99)
Potassium: 4.6 mmol/L (ref 3.5–5.2)
Sodium: 137 mmol/L (ref 134–144)
Total Protein: 7.3 g/dL (ref 6.0–8.5)
eGFR: 87 mL/min/{1.73_m2} (ref >59)

## 2021-01-06 LAB — THYROID PANEL WITH TSH
Free Thyroxine Index: 1.2 (ref 1.2–4.9)
T3 Uptake Ratio: 24 % (ref 24–39)
T4, Total: 5.1 ug/dL (ref 4.5–12.0)
TSH: 7.59 u[IU]/mL — ABNORMAL HIGH (ref 0.450–4.500)

## 2021-01-06 MED ORDER — LEVOTHYROXINE SODIUM 25 MCG PO TABS: 25.0000 ug | ORAL_TABLET | Freq: Every day | ORAL | 1 refills | Status: DC

## 2021-01-06 NOTE — Progress Notes (Signed)
Please let patient know that he needs to take the medication on an empty stomach and only water.  Can not take with other medications. Needs to wait 30 minutes prior to eating, drinking or taking other medications.   We will recheck labs in 6 weeks. Please make patient a lab appointment.

## 2021-01-06 NOTE — Addendum Note (Signed)
Addended by: Larae Grooms on: 01/06/2021 09:36 AM   Modules accepted: Orders

## 2021-01-06 NOTE — Progress Notes (Signed)
Please let patient know that his lab work shows that his TSH is elevated.  This means that the thyroid is not functioning properly.  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  I recommend you start Levothyroxine once daily.  If you agree I will send this to the pharmacy for him.  Otherwise, liver, kidney, and electrolytes look good.  Please let me know if you have any questions.

## 2021-02-17 ENCOUNTER — Other Ambulatory Visit: Payer: Medicare Other

## 2021-02-17 ENCOUNTER — Other Ambulatory Visit: Payer: Self-pay

## 2021-02-17 DIAGNOSIS — R7989 Other specified abnormal findings of blood chemistry: Secondary | ICD-10-CM

## 2021-02-17 DIAGNOSIS — I10 Essential (primary) hypertension: Secondary | ICD-10-CM

## 2021-02-17 DIAGNOSIS — E7800 Pure hypercholesterolemia, unspecified: Secondary | ICD-10-CM

## 2021-02-17 DIAGNOSIS — E038 Other specified hypothyroidism: Secondary | ICD-10-CM

## 2021-02-18 ENCOUNTER — Ambulatory Visit (INDEPENDENT_AMBULATORY_CARE_PROVIDER_SITE_OTHER): Payer: Medicare Other

## 2021-02-18 ENCOUNTER — Ambulatory Visit
Admission: EM | Admit: 2021-02-18 | Discharge: 2021-02-18 | Disposition: A | Discharge status: Home / Self Care | Payer: Medicare Other | Attending: Family Medicine | Admitting: Family Medicine

## 2021-02-18 ENCOUNTER — Encounter: Payer: Self-pay | Admitting: Emergency Medicine

## 2021-02-18 DIAGNOSIS — M25471 Effusion, right ankle: Secondary | ICD-10-CM

## 2021-02-18 DIAGNOSIS — M7751 Other enthesopathy of right foot: Secondary | ICD-10-CM | POA: Diagnosis not present

## 2021-02-18 DIAGNOSIS — M25571 Pain in right ankle and joints of right foot: Secondary | ICD-10-CM | POA: Diagnosis not present

## 2021-02-18 LAB — COMPREHENSIVE METABOLIC PANEL
ALT: 32 IU/L (ref 0–44)
AST: 28 IU/L (ref 0–40)
Albumin/Globulin Ratio: 1.5 (ref 1.2–2.2)
Albumin: 4.7 g/dL (ref 3.8–4.8)
Alkaline Phosphatase: 74 IU/L (ref 44–121)
BUN/Creatinine Ratio: 18 (ref 10–24)
BUN: 17 mg/dL (ref 8–27)
Bilirubin Total: 0.4 mg/dL (ref 0.0–1.2)
CO2: 24 mmol/L (ref 20–29)
Calcium: 10 mg/dL (ref 8.6–10.2)
Chloride: 96 mmol/L (ref 96–106)
Creatinine, Ser: 0.96 mg/dL (ref 0.76–1.27)
Globulin, Total: 3.2 g/dL (ref 1.5–4.5)
Glucose: 109 mg/dL — ABNORMAL HIGH (ref 65–99)
Potassium: 4.8 mmol/L (ref 3.5–5.2)
Sodium: 136 mmol/L (ref 134–144)
Total Protein: 7.9 g/dL (ref 6.0–8.5)
eGFR: 87 mL/min/{1.73_m2} (ref >59)

## 2021-02-18 LAB — T4, FREE: Free T4: 0.94 ng/dL (ref 0.82–1.77)

## 2021-02-18 LAB — TSH: TSH: 13 u[IU]/mL — ABNORMAL HIGH (ref 0.450–4.500)

## 2021-02-18 MED ORDER — PREDNISONE 50 MG PO TABS: 50.0000 mg | ORAL_TABLET | Freq: Every day | ORAL | 0 refills | Status: AC

## 2021-02-18 NOTE — ED Triage Notes (Signed)
Pt c/o right ankle pain that started 3 days ago. Pt denies any injury.

## 2021-02-18 NOTE — Discharge Instructions (Addendum)
Keep right ankle elevated  and apply ice for the next 48 hours then switch to heat. Keep ankle brace on with all weight bearing activities. Complete entire course of prednisone. Follow-up with orthopedics if no improvement following completion of prednisone.

## 2021-02-18 NOTE — ED Provider Notes (Signed)
Brad Flores    CSN: [id number] Arrival date & time: 02/18/21  5329      History   Chief Complaint Chief Complaint  Patient presents with   Ankle Pain    HPI Brad Flores is a 65 y.o. male.   HPI Patient presents today with acute onset ankle pain which has progressed over the last 2.5 days. Unknown of any twisting or rolling injury. Today he is unable to bear weight without significant pain.  Pain is most prominent talus and lateral malleolus region.  Past Medical History:  Diagnosis Date   COVID-19 11/2019   Hyperlipidemia    Hypertension     Patient Active Problem List   Diagnosis Date Noted   Hypercholesterolemia 07/08/2018   Essential hypertension 07/08/2018    Past Surgical History:  Procedure Laterality Date   ACHILLES TENDON LENGTHENING     CARPAL TUNNEL RELEASE Right    HAND TENDON SURGERY     HERNIA REPAIR     KNEE SURGERY     ROTATOR CUFF REPAIR         Home Medications    Prior to Admission medications   Medication Sig Start Date End Date Taking? Authorizing Provider  aspirin EC 81 MG tablet Take 1 tablet (81 mg total) by mouth daily. Swallow whole. 01/27/20  Yes Cook, Jayce G, DO  atorvastatin (LIPITOR) 80 MG tablet Take 1 tablet (80 mg total) by mouth daily. 12/09/20  Yes Larae Grooms, NP  levothyroxine (SYNTHROID) 25 MCG tablet Take 1 tablet (25 mcg total) by mouth daily. 01/06/21  Yes Larae Grooms, NP  lisinopril (ZESTRIL) 40 MG tablet Take 1 tablet (40 mg total) by mouth daily. 12/05/20  Yes Larae Grooms, NP  predniSONE (DELTASONE) 50 MG tablet Take 1 tablet (50 mg total) by mouth daily with breakfast for 5 days. 02/18/21 02/23/21 Yes Bing Neighbors, FNP  gemfibrozil (LOPID) 600 MG tablet Take 1 tablet (600 mg total) by mouth 2 (two) times daily before a meal. Patient not taking: Reported on 12/05/2020 01/27/20   Tommie Sams, DO    Family History Family History  Problem Relation Age of Onset   Heart disease  Mother    Heart disease Father     Social History Social History   Tobacco Use   Smoking status: Never   Smokeless tobacco: Never  Vaping Use   Vaping Use: Never used  Substance Use Topics   Alcohol use: Yes    Comment: on occasion   Drug use: Never     Allergies   Patient has no known allergies.   Review of Systems Review of Systems Pertinent negatives listed in HPI   Physical Exam Triage Vital Signs ED Triage Vitals [02/18/21 0824]  Enc Vitals Group     BP (!) 152/92     Pulse Rate 77     Resp      Temp 98.5 F (36.9 C)     Temp Source Oral     SpO2 95 %     Weight      Height      Head Circumference      Peak Flow      Pain Score 8     Pain Loc      Pain Edu?      Excl. in GC?    No data found.  Updated Vital Signs BP (!) 152/92 (BP Location: Left Arm)   Pulse 77   Temp 98.5 F (36.9 C) (Oral)  SpO2 95%   Visual Acuity Right Eye Distance:   Left Eye Distance:   Bilateral Distance:    Right Eye Near:   Left Eye Near:    Bilateral Near:     Physical Exam Constitutional:      Appearance: Normal appearance.  HENT:     Head: Normocephalic and atraumatic.  Cardiovascular:     Rate and Rhythm: Normal rate.  Pulmonary:     Effort: Pulmonary effort is normal.  Musculoskeletal:     Right ankle: Swelling present. No deformity. Decreased range of motion.  Skin:    General: Skin is warm and dry.  Neurological:     General: No focal deficit present.     Mental Status: He is alert.  Psychiatric:        Mood and Affect: Mood normal.     UC Treatments / Results  Labs (all labs ordered are listed, but only abnormal results are displayed) Labs Reviewed - No data to display  EKG   Radiology DG Ankle Complete Right  Result Date: 02/18/2021 CLINICAL DATA:  65 year old male with right ankle pain for 3 days but no known injury. Unable to weightbear. EXAM: RIGHT ANKLE - COMPLETE 3+ VIEW COMPARISON:  None. FINDINGS: Mortise joint alignment  preserved. Talar dome intact. Small chronic appearing ossific fragments at both the medial and lateral malleolus. No acute fracture of the distal tibia or fibula identified. Talus and calcaneus appear intact. But possible joint effusion is present on the lateral, along with mild anterior and lateral soft tissue swelling at the ankle. Visible bones of the right foot appear intact. IMPRESSION: Mild soft tissue swelling and possible ankle joint effusion. Small chronic ossific fragments at the malleoli, but no acute fracture or dislocation identified. Electronically Signed   By: Odessa Fleming M.D.   On: 02/18/2021 08:43    Procedures Procedures (including critical care time)  Medications Ordered in UC Medications - No data to display  Initial Impression / Assessment and Plan / UC Course  I have reviewed the triage vital signs and the nursing notes.  Pertinent labs & imaging results that were available during my care of the patient were reviewed by me and considered in my medical decision making (see chart for details).    Right ankle tendinitis with right ankle joint effusion  Treatment with prednisone  ASO Lace-up with all weight bearing Elevated with ice x 2 days then switch to heat application Follow-up with orthopedics PRN Final Clinical Impressions(s) / UC Diagnoses   Final diagnoses:  Right ankle tendonitis  Ankle joint effusion, right     Discharge Instructions      Keep right ankle elevated  and apply ice for the next 48 hours then switch to heat. Keep ankle brace on with all weight bearing activities. Complete entire course of prednisone. Follow-up with orthopedics if no improvement following completion of prednisone.      ED Prescriptions     Medication Sig Dispense Auth. Provider   predniSONE (DELTASONE) 50 MG tablet Take 1 tablet (50 mg total) by mouth daily with breakfast for 5 days. 5 tablet Bing Neighbors, FNP      PDMP not reviewed this encounter.   Bing Neighbors, [place] 02/18/21 [phone]

## 2021-02-19 NOTE — Progress Notes (Signed)
Please let patient know that his thyroid labs are higher than before.  Please find out if he is taking the Levothyroxine.  If he is please find out if he is taking it on an empty stomach with only water and waiting at least 30 minutes prior to eating or drinking anything else  If he is, we will need to increase the dose and repeat the labs in 6-8 weeks.

## 2021-03-22 ENCOUNTER — Ambulatory Visit: Payer: Self-pay | Admitting: *Deleted

## 2021-03-22 NOTE — Telephone Encounter (Signed)
Right ankle pain and swelling for 2-3 days. No injury/accident. Slightly warm to touch. Seen at UC 5 weeks ago with same/dx with arthritis. Care advice includes ibuprofen and elevating foot this evening. Appointment made for tomorrow with Clydie Braun.

## 2021-03-22 NOTE — Telephone Encounter (Signed)
Reason for Disposition  [1] MODERATE pain (e.g., interferes with normal activities, limping) AND [2] present > 3 days  Answer Assessment - Initial Assessment Questions 1. LOCATION: "Which ankle is swollen?" "Where is the swelling?"     Right ankle 2. ONSET: "When did the swelling start?"     2 days ago 3. SIZE: "How large is the swelling?"     Outer to inner swelling, not quite double size of other ankle 4. PAIN: "Is there any pain?" If Yes, ask: "How bad is it?" (Scale 1-10; or mild, moderate, severe)   - NONE (0): no pain.   - MILD (1-3): doesn't interfere with normal activities.    - MODERATE (4-7): interferes with normal activities (e.g., work or school) or awakens from sleep, limping.    - SEVERE (8-10): excruciating pain, unable to do any normal activities, unable to walk. 6 on scale     Came on gradual 5. CAUSE: "What do you think caused the ankle swelling?"     Was told arthritis 5 weeks back.  6. OTHER SYMPTOMS: "Do you have any other symptoms?" (e.g., fever, chest pain, difficulty breathing, calf pain)     none 7. PREGNANCY: "Is there any chance you are pregnant?" "When was your last menstrual period?"     na  Protocols used: Ankle Swelling-A-AH

## 2021-03-23 ENCOUNTER — Encounter: Payer: Self-pay | Admitting: Nurse Practitioner

## 2021-03-23 ENCOUNTER — Ambulatory Visit (INDEPENDENT_AMBULATORY_CARE_PROVIDER_SITE_OTHER): Payer: Medicare Other | Admitting: Nurse Practitioner

## 2021-03-23 ENCOUNTER — Other Ambulatory Visit: Payer: Self-pay

## 2021-03-23 VITALS — BP 153/101 | HR 77 | Ht 67.68 in | Wt 199.8 lb

## 2021-03-23 DIAGNOSIS — M109 Gout, unspecified: Secondary | ICD-10-CM

## 2021-03-23 DIAGNOSIS — E038 Other specified hypothyroidism: Secondary | ICD-10-CM | POA: Diagnosis not present

## 2021-03-23 MED ORDER — PREDNISONE 10 MG PO TABS: 10.0000 mg | ORAL_TABLET | Freq: Every day | ORAL | 0 refills | Status: DC

## 2021-03-23 NOTE — Progress Notes (Signed)
BP (!) 153/101   Pulse 77   Ht 5' 7.68" (1.719 m)   Wt 199 lb 12.8 oz (90.6 kg)   SpO2 98%   BMI 30.67 kg/m    Subjective:    Patient ID: Brad Flores, male    DOB: 1955-11-06, 65 y.o.   MRN: [id number]  HPI: Brad Flores is a 65 y.o. male  Chief Complaint  Patient presents with   Ankle Injury   Patient presents to clinic today with ankle/foot swelling.  Patient states this happened 5 weeks ago. Patient was given a prescription for prednisone which cleared it up.  Patient tried icing it.  Patient states his pain is 6/10.  Denies fever and warmth.  Patient does have a history of gout in the same foot.   Relevant past medical, surgical, family and social history reviewed and updated as indicated. Interim medical history since our last visit reviewed. Allergies and medications reviewed and updated.  Review of Systems  Musculoskeletal:        Right ankle and foot swelling and pain   Per HPI unless specifically indicated above     Objective:    BP (!) 153/101   Pulse 77   Ht 5' 7.68" (1.719 m)   Wt 199 lb 12.8 oz (90.6 kg)   SpO2 98%   BMI 30.67 kg/m   Wt Readings from Last 3 Encounters:  03/23/21 199 lb 12.8 oz (90.6 kg)  01/05/21 196 lb (88.9 kg)  12/05/20 199 lb 2 oz (90.3 kg)    Physical Exam Vitals and nursing note reviewed.  Constitutional:      General: He is not in acute distress.    Appearance: Normal appearance. He is not ill-appearing, toxic-appearing or diaphoretic.  HENT:     Head: Normocephalic.     Right Ear: External ear normal.     Left Ear: External ear normal.     Nose: Nose normal. No congestion or rhinorrhea.     Mouth/Throat:     Mouth: Mucous membranes are moist.  Eyes:     General:        Right eye: No discharge.        Left eye: No discharge.     Extraocular Movements: Extraocular movements intact.     Conjunctiva/sclera: Conjunctivae normal.     Pupils: Pupils are equal, round, and reactive to light.   Cardiovascular:     Rate and Rhythm: Normal rate and regular rhythm.     Heart sounds: No murmur heard. Pulmonary:     Effort: Pulmonary effort is normal. No respiratory distress.     Breath sounds: Normal breath sounds. No wheezing, rhonchi or rales.  Abdominal:     General: Abdomen is flat. Bowel sounds are normal.  Musculoskeletal:     Cervical back: Normal range of motion and neck supple.     Right lower leg: Edema present.  Feet:     Right foot:     Skin integrity: Erythema and warmth present.  Skin:    General: Skin is warm and dry.     Capillary Refill: Capillary refill takes less than 2 seconds.  Neurological:     General: No focal deficit present.     Mental Status: He is alert and oriented to person, place, and time.  Psychiatric:        Mood and Affect: Mood normal.        Behavior: Behavior normal.        Thought Content:  Thought content normal.        Judgment: Judgment normal.    Results for orders placed or performed in visit on 02/17/21  T4, free  Result Value Ref Range   Free T4 0.94 0.82 - 1.77 ng/dL  TSH  Result Value Ref Range   TSH 13.000 (H) 0.450 - 4.500 uIU/mL  Comprehensive metabolic panel  Result Value Ref Range   Glucose 109 (H) 65 - 99 mg/dL   BUN 17 8 - 27 mg/dL   Creatinine, Ser 0.96 0.76 - 1.27 mg/dL   eGFR 87 >59 mL/min/1.73   BUN/Creatinine Ratio 18 10 - 24   Sodium 136 134 - 144 mmol/L   Potassium 4.8 3.5 - 5.2 mmol/L   Chloride 96 96 - 106 mmol/L   CO2 24 20 - 29 mmol/L   Calcium 10.0 8.6 - 10.2 mg/dL   Total Protein 7.9 6.0 - 8.5 g/dL   Albumin 4.7 3.8 - 4.8 g/dL   Globulin, Total 3.2 1.5 - 4.5 g/dL   Albumin/Globulin Ratio 1.5 1.2 - 2.2   Bilirubin Total 0.4 0.0 - 1.2 mg/dL   Alkaline Phosphatase 74 44 - 121 IU/L   AST 28 0 - 40 IU/L   ALT 32 0 - 44 IU/L      Assessment & Plan:   Problem List Items Addressed This Visit   None Visit Diagnoses     Subclinical hypothyroidism    -  Primary   Will recheck lab work at  visit today. Discussed with patient today how to properly take medication. Will make recommendations based on lab results.    Relevant Orders   TSH   T4, free   Acute gout of right ankle, unspecified cause       Will treat with prednisone x5 days. Follow up in 2 weeks to start Allopurinol and have a Uric acid drawn.    Relevant Medications   predniSONE (DELTASONE) 10 MG tablet        Follow up plan: Return in about 2 weeks (around 04/06/2021) for Follow up on foot.  A total of 30 minutes were spent on this encounter today.  When total time is documented, this includes both the face-to-face and non-face-to-face time personally spent before, during and after the visit on the date of the encounter discussing treatment plan for gout and how to properly take levothyroxine.

## 2021-03-24 LAB — T4, FREE: Free T4: 0.86 ng/dL (ref 0.82–1.77)

## 2021-03-24 LAB — TSH: TSH: 4.61 u[IU]/mL — ABNORMAL HIGH (ref 0.450–4.500)

## 2021-03-24 NOTE — Progress Notes (Signed)
Please let patient know that his thyroid labs have improved since starting the levothyroxine.  Recommend he take it first thing in the morning on an empty stomach as discussed during the visit.  We will recheck it in 6 weeks. Please let me know if he has any questions.

## 2021-03-28 ENCOUNTER — Other Ambulatory Visit: Payer: Self-pay | Admitting: Family Medicine

## 2021-04-06 ENCOUNTER — Ambulatory Visit (INDEPENDENT_AMBULATORY_CARE_PROVIDER_SITE_OTHER): Payer: Medicare Other | Admitting: Nurse Practitioner

## 2021-04-06 ENCOUNTER — Encounter: Payer: Self-pay | Admitting: Nurse Practitioner

## 2021-04-06 ENCOUNTER — Other Ambulatory Visit: Payer: Self-pay

## 2021-04-06 VITALS — BP 127/82 | HR 91 | Wt 201.8 lb

## 2021-04-06 DIAGNOSIS — M109 Gout, unspecified: Secondary | ICD-10-CM

## 2021-04-06 NOTE — Progress Notes (Signed)
BP 127/82   Pulse 91   Wt 201 lb 12.8 oz (91.5 kg)   SpO2 90%   BMI 30.97 kg/m    Subjective:    Patient ID: Brad Flores, Brad Flores    DOB: 04/09/1956, 65 y.o.   MRN: [id number]  HPI: Brad Flores is a 65 y.o. Brad Flores  Chief Complaint  Patient presents with   Gout    Patient states since he was prescribed the Prednisone and states within day 2 the flare up was cleared up. Patient states his ankle feels a better.    FOOT PAIN Patient states his pain is gone.  States the pain was gone within two days.  States he has had 2-3 flares of gout in his lifestyle.  Denies concerns at visit today.  He cooks most meals at home and doesn't eat a lot of red meat.  Relevant past medical, surgical, family and social history reviewed and updated as indicated. Interim medical history since our last visit reviewed. Allergies and medications reviewed and updated.  Review of Systems  Musculoskeletal:        Denies foot pain   Per HPI unless specifically indicated above     Objective:    BP 127/82   Pulse 91   Wt 201 lb 12.8 oz (91.5 kg)   SpO2 90%   BMI 30.97 kg/m   Wt Readings from Last 3 Encounters:  04/06/21 201 lb 12.8 oz (91.5 kg)  03/23/21 199 lb 12.8 oz (90.6 kg)  01/05/21 196 lb (88.9 kg)    Physical Exam Vitals and nursing note reviewed.  Constitutional:      General: He is not in acute distress.    Appearance: Normal appearance. He is not ill-appearing, toxic-appearing or diaphoretic.  HENT:     Head: Normocephalic.     Right Ear: External ear normal.     Left Ear: External ear normal.     Nose: Nose normal. No congestion or rhinorrhea.     Mouth/Throat:     Mouth: Mucous membranes are moist.  Eyes:     General:        Right eye: No discharge.        Left eye: No discharge.     Extraocular Movements: Extraocular movements intact.     Conjunctiva/sclera: Conjunctivae normal.     Pupils: Pupils are equal, round, and reactive to light.  Cardiovascular:      Rate and Rhythm: Normal rate and regular rhythm.     Heart sounds: No murmur heard. Pulmonary:     Effort: Pulmonary effort is normal. No respiratory distress.     Breath sounds: Normal breath sounds. No wheezing, rhonchi or rales.  Abdominal:     General: Abdomen is flat. Bowel sounds are normal.  Musculoskeletal:     Cervical back: Normal range of motion and neck supple.  Skin:    General: Skin is warm and dry.     Capillary Refill: Capillary refill takes less than 2 seconds.  Neurological:     General: No focal deficit present.     Mental Status: He is alert and oriented to person, place, and time.  Psychiatric:        Mood and Affect: Mood normal.        Behavior: Behavior normal.        Thought Content: Thought content normal.        Judgment: Judgment normal.    Results for orders placed or performed in visit on  03/23/21  TSH  Result Value Ref Range   TSH 4.610 (H) 0.450 - 4.500 uIU/mL  T4, free  Result Value Ref Range   Free T4 0.86 0.82 - 1.77 ng/dL      Assessment & Plan:   Problem List Items Addressed This Visit   None Visit Diagnoses     Gout of foot, unspecified cause, unspecified chronicity, unspecified laterality    -  Primary   Patient would like to hold off on starting allopurinol. Discussed that if he has a 3rd flare this year we should consider starting allopurinol daily.  FU PRN.        Follow up plan: Return if symptoms worsen or fail to improve.

## 2021-06-07 ENCOUNTER — Other Ambulatory Visit: Payer: Self-pay | Admitting: Nurse Practitioner

## 2021-06-07 DIAGNOSIS — I10 Essential (primary) hypertension: Secondary | ICD-10-CM

## 2021-06-07 NOTE — Telephone Encounter (Signed)
Requested Prescriptions  Pending Prescriptions Disp Refills   lisinopril (ZESTRIL) 40 MG tablet [Pharmacy Med Name: LISINOPRIL 40 MG TABLET] 90 tablet 1    Sig: TAKE 1 TABLET BY MOUTH EVERY DAY     Cardiovascular:  ACE Inhibitors Passed - 06/07/2021  1:22 AM      Passed - Cr in normal range and within 180 days    Creatinine, Ser  Date Value Ref Range Status  02/17/2021 0.96 0.76 - 1.27 mg/dL Final         Passed - K in normal range and within 180 days    Potassium  Date Value Ref Range Status  02/17/2021 4.8 3.5 - 5.2 mmol/L Final         Passed - Patient is not pregnant      Passed - Last BP in normal range    BP Readings from Last 1 Encounters:  04/06/21 127/82         Passed - Valid encounter within last 6 months    Recent Outpatient Visits          2 months ago Gout of foot, unspecified cause, unspecified chronicity, unspecified laterality   [facility] Larae Grooms, NP   2 months ago Subclinical hypothyroidism   [facility] Larae Grooms, NP   5 months ago Essential hypertension   [facility] Larae Grooms, NP   6 months ago Essential hypertension   [facility] Larae Grooms, NP      Future Appointments            In 1 month Larae Grooms, NP [facility]

## 2021-07-05 ENCOUNTER — Other Ambulatory Visit: Payer: Self-pay | Admitting: Nurse Practitioner

## 2021-07-05 DIAGNOSIS — R7989 Other specified abnormal findings of blood chemistry: Secondary | ICD-10-CM

## 2021-07-05 NOTE — Telephone Encounter (Signed)
Requested Prescriptions  Pending Prescriptions Disp Refills   levothyroxine (SYNTHROID) 25 MCG tablet [Pharmacy Med Name: LEVOTHYROXINE 25 MCG TABLET] 90 tablet 1    Sig: TAKE 1 TABLET BY MOUTH EVERY DAY     Endocrinology:  Hypothyroid Agents Failed - 07/05/2021  1:36 AM      Failed - TSH in normal range and within 360 days    TSH  Date Value Ref Range Status  03/23/2021 4.610 (H) 0.450 - 4.500 uIU/mL Final         Passed - Valid encounter within last 12 months    Recent Outpatient Visits          3 months ago Gout of foot, unspecified cause, unspecified chronicity, unspecified laterality   [facility] Jon Billings, NP   3 months ago Subclinical hypothyroidism   [facility] Jon Billings, NP   6 months ago Essential hypertension   [place], NP   7 months ago Essential hypertension   [facility] Jon Billings, NP      Future Appointments            In 5 days Jon Billings, NP [facility], [place]

## 2021-07-10 ENCOUNTER — Ambulatory Visit (INDEPENDENT_AMBULATORY_CARE_PROVIDER_SITE_OTHER): Payer: Medicare Other | Admitting: Nurse Practitioner

## 2021-07-10 ENCOUNTER — Other Ambulatory Visit: Payer: Self-pay

## 2021-07-10 ENCOUNTER — Encounter: Payer: Self-pay | Admitting: Nurse Practitioner

## 2021-07-10 VITALS — BP 134/87 | HR 69 | Temp 97.8°F | Ht 67.5 in | Wt 199.4 lb

## 2021-07-10 DIAGNOSIS — E038 Other specified hypothyroidism: Secondary | ICD-10-CM

## 2021-07-10 DIAGNOSIS — E7800 Pure hypercholesterolemia, unspecified: Secondary | ICD-10-CM

## 2021-07-10 DIAGNOSIS — I10 Essential (primary) hypertension: Secondary | ICD-10-CM

## 2021-07-10 DIAGNOSIS — R5383 Other fatigue: Secondary | ICD-10-CM | POA: Diagnosis not present

## 2021-07-10 MED ORDER — LISINOPRIL 40 MG PO TABS: 40.0000 mg | ORAL_TABLET | Freq: Every day | ORAL | 1 refills | Status: DC

## 2021-07-10 NOTE — Assessment & Plan Note (Signed)
Chronic.  Controlled.  Continue with current medication regimen of Atorvastatin 80mg daily.  Labs ordered today.  Return to clinic in 6 months for reevaluation.  Call sooner if concerns arise.  

## 2021-07-10 NOTE — Assessment & Plan Note (Signed)
Chronic.  Controlled.  Continue with current medication regimen on Lisinopril 40mg  daily.  Refill sent today.  Labs ordered today.  Return to clinic in 6 months for reevaluation.  Call sooner if concerns arise.

## 2021-07-10 NOTE — Assessment & Plan Note (Signed)
Chronic.  Controlled.  Continue with current medication regimen on levothyroxine 98mcg daily.  Labs ordered today.  Return to clinic in 6 months for reevaluation.  Call sooner if concerns arise.

## 2021-07-10 NOTE — Progress Notes (Signed)
BP 134/87    Pulse 69    Temp 97.8 F (36.6 C) (Oral)    Ht 5' 7.5" (1.715 m)    Wt 199 lb 6.4 oz (90.4 kg)    SpO2 98%    BMI 30.77 kg/m    Subjective:    Patient ID: Brad Flores, male    DOB: May 22, 1956, 66 y.o.   MRN: [id number]  HPI: Brad Flores is a 66 y.o. male  Chief Complaint  Patient presents with   Diabetes   Hyperlipidemia   Hypertension    HYPERTENSION Hypertension status: controlled  Satisfied with current treatment? yes Duration of hypertension: years BP monitoring frequency:  daily BP range: 120/70 BP medication side effects:  no Medication compliance: excellent compliance Previous BP meds:lisinopril Aspirin: no Recurrent headaches: no Visual changes: no Palpitations: no Dyspnea: no Chest pain: no Lower extremity edema: no Dizzy/lightheaded: no  HYPOTHYROIDISM Thyroid control status:controlled Satisfied with current treatment? yes Medication side effects: no Medication compliance: excellent compliance Etiology of hypothyroidism:  Recent dose adjustment:no Fatigue: no Cold intolerance: no Heat intolerance: no Weight gain: no Weight loss: no Constipation: no Diarrhea/loose stools: no Palpitations: no Lower extremity edema: no Anxiety/depressed mood: no  Relevant past medical, surgical, family and social history reviewed and updated as indicated. Interim medical history since our last visit reviewed. Allergies and medications reviewed and updated.  Review of Systems  Constitutional:  Negative for fatigue and unexpected weight change.  Eyes:  Negative for visual disturbance.  Respiratory:  Negative for shortness of breath.   Cardiovascular:  Negative for chest pain, palpitations and leg swelling.  Gastrointestinal:  Negative for constipation and diarrhea.  Endocrine: Negative for cold intolerance and heat intolerance.  Neurological:  Negative for light-headedness and headaches.  Psychiatric/Behavioral:  Negative for  dysphoric mood. The patient is not nervous/anxious.    Per HPI unless specifically indicated above     Objective:    BP 134/87    Pulse 69    Temp 97.8 F (36.6 C) (Oral)    Ht 5' 7.5" (1.715 m)    Wt 199 lb 6.4 oz (90.4 kg)    SpO2 98%    BMI 30.77 kg/m   Wt Readings from Last 3 Encounters:  07/10/21 199 lb 6.4 oz (90.4 kg)  04/06/21 201 lb 12.8 oz (91.5 kg)  03/23/21 199 lb 12.8 oz (90.6 kg)    Physical Exam Vitals and nursing note reviewed.  Constitutional:      General: He is not in acute distress.    Appearance: Normal appearance. He is not ill-appearing, toxic-appearing or diaphoretic.  HENT:     Head: Normocephalic.     Right Ear: External ear normal.     Left Ear: External ear normal.     Nose: Nose normal. No congestion or rhinorrhea.     Mouth/Throat:     Mouth: Mucous membranes are moist.  Eyes:     General:        Right eye: No discharge.        Left eye: No discharge.     Extraocular Movements: Extraocular movements intact.     Conjunctiva/sclera: Conjunctivae normal.     Pupils: Pupils are equal, round, and reactive to light.  Cardiovascular:     Rate and Rhythm: Normal rate and regular rhythm.     Heart sounds: No murmur heard. Pulmonary:     Effort: Pulmonary effort is normal. No respiratory distress.     Breath sounds: Normal breath sounds.  No wheezing, rhonchi or rales.  Abdominal:     General: Abdomen is flat. Bowel sounds are normal.  Musculoskeletal:     Cervical back: Normal range of motion and neck supple.  Skin:    General: Skin is warm and dry.     Capillary Refill: Capillary refill takes less than 2 seconds.  Neurological:     General: No focal deficit present.     Mental Status: He is alert and oriented to person, place, and time.  Psychiatric:        Mood and Affect: Mood normal.        Behavior: Behavior normal.        Thought Content: Thought content normal.        Judgment: Judgment normal.    Results for orders placed or  performed in visit on 03/23/21  TSH  Result Value Ref Range   TSH 4.610 (H) 0.450 - 4.500 uIU/mL  T4, free  Result Value Ref Range   Free T4 0.86 0.82 - 1.77 ng/dL      Assessment & Plan:   Problem List Items Addressed This Visit       Cardiovascular and Mediastinum   Essential hypertension - Primary    Chronic.  Controlled.  Continue with current medication regimen on Lisinopril 1m daily.  Refill sent today.  Labs ordered today.  Return to clinic in 6 months for reevaluation.  Call sooner if concerns arise.        Relevant Medications   lisinopril (ZESTRIL) 40 MG tablet   Other Relevant Orders   Comp Met (CMET)     Endocrine   Subclinical hypothyroidism    Chronic.  Controlled.  Continue with current medication regimen on levothyroxine 259m daily.  Labs ordered today.  Return to clinic in 6 months for reevaluation.  Call sooner if concerns arise.        Relevant Orders   TSH   T4, free     Other   Hypercholesterolemia    Chronic.  Controlled.  Continue with current medication regimen of Atorvastatin 8075maily.  Labs ordered today.  Return to clinic in 6 months for reevaluation.  Call sooner if concerns arise.        Relevant Medications   lisinopril (ZESTRIL) 40 MG tablet   Other Relevant Orders   Lipid Profile   Other Visit Diagnoses     Fatigue, unspecified type       Patient would like to have his testosterone checked at visit today.    Relevant Orders   Testosterone, free, total([facility])        Follow up plan: Return in about 6 months (around 01/07/2022) for HTN, HLD, DM2 FU.

## 2021-07-11 NOTE — Progress Notes (Signed)
Please let patient know that his lab work shows that his cholesterol is not well controlled.  Triglycerides are elevated.  Likely due to not fasting.  We will check it fasting at next visit.  Hi Total cholesterol and bad cholesterol improved from prior but are still elevated.  Continue with his diet changes and we will likely see this improve.  His thyroid labs remain the same.  We will continue to same dose at this time.  His testosterone is within normal limits.  Please let me know if he has any questions.

## 2021-07-15 LAB — COMPREHENSIVE METABOLIC PANEL
ALT: 37 IU/L (ref 0–44)
AST: 26 IU/L (ref 0–40)
Albumin/Globulin Ratio: 1.8 (ref 1.2–2.2)
Albumin: 4.6 g/dL (ref 3.8–4.8)
Alkaline Phosphatase: 62 IU/L (ref 44–121)
BUN/Creatinine Ratio: 22 (ref 10–24)
BUN: 22 mg/dL (ref 8–27)
Bilirubin Total: 0.3 mg/dL (ref 0.0–1.2)
CO2: 23 mmol/L (ref 20–29)
Calcium: 9.6 mg/dL (ref 8.6–10.2)
Chloride: 102 mmol/L (ref 96–106)
Creatinine, Ser: 1.02 mg/dL (ref 0.76–1.27)
Globulin, Total: 2.6 g/dL (ref 1.5–4.5)
Glucose: 106 mg/dL — ABNORMAL HIGH (ref 70–99)
Potassium: 4.4 mmol/L (ref 3.5–5.2)
Sodium: 140 mmol/L (ref 134–144)
Total Protein: 7.2 g/dL (ref 6.0–8.5)
eGFR: 81 mL/min/{1.73_m2} (ref >59)

## 2021-07-15 LAB — T4, FREE: Free T4: 0.97 ng/dL (ref 0.82–1.77)

## 2021-07-15 LAB — TESTOSTERONE, FREE, TOTAL, SHBG
Sex Hormone Binding: 25 nmol/L (ref 19.3–76.4)
Testosterone, Free: 8.4 pg/mL (ref 6.6–18.1)
Testosterone: 314 ng/dL (ref 264–916)

## 2021-07-15 LAB — LIPID PANEL
Chol/HDL Ratio: 6.5 ratio — ABNORMAL HIGH (ref 0.0–5.0)
Cholesterol, Total: 278 mg/dL — ABNORMAL HIGH (ref 100–199)
HDL: 43 mg/dL (ref >39)
LDL Chol Calc (NIH): 175 mg/dL — ABNORMAL HIGH (ref 0–99)
Triglycerides: 310 mg/dL — ABNORMAL HIGH (ref 0–149)
VLDL Cholesterol Cal: 60 mg/dL — ABNORMAL HIGH (ref 5–40)

## 2021-07-15 LAB — TSH: TSH: 4.61 u[IU]/mL — ABNORMAL HIGH (ref 0.450–4.500)

## 2021-08-03 IMAGING — CT CT HEAD W/O CM
1 series · 15 of 30 positions shown, 19 images · non-contrast
Comparison: None.

CLINICAL DATA: 64-year-old who presents with intermittent LEFT
UPPER EXTREMITY numbness, facial numbness and LEFT lower extremity
numbness that began about 4 days ago.

EXAM:
CT HEAD WITHOUT CONTRAST
TECHNIQUE: Contiguous axial images were obtained from the base of the skull
through the vertex without intravenous contrast.

[Series 2: head wo · axial · 0.44mm/px · z∈[-169,-34]mm · 15 of 31 slices shown, 19 images]
[im 2/31  brain]
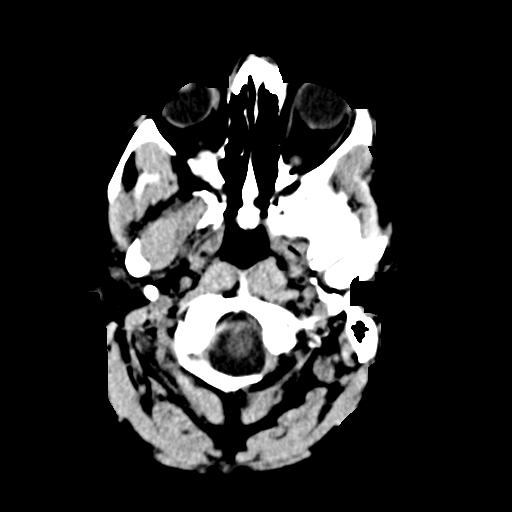
[im 2/31  bone]
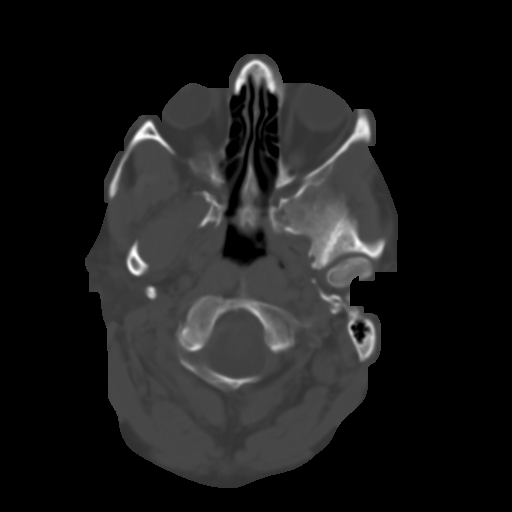
[im 4/31  brain]
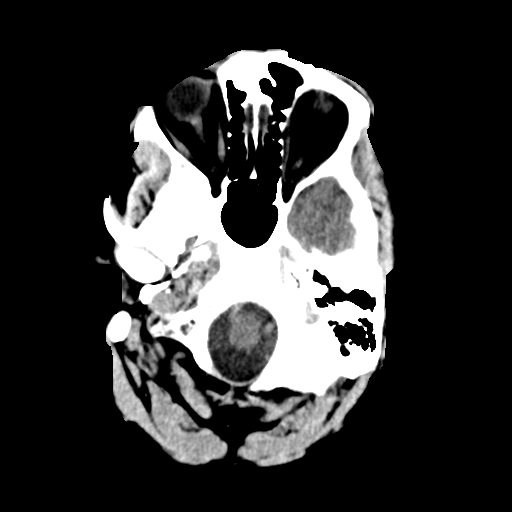
[im 6/31  brain]
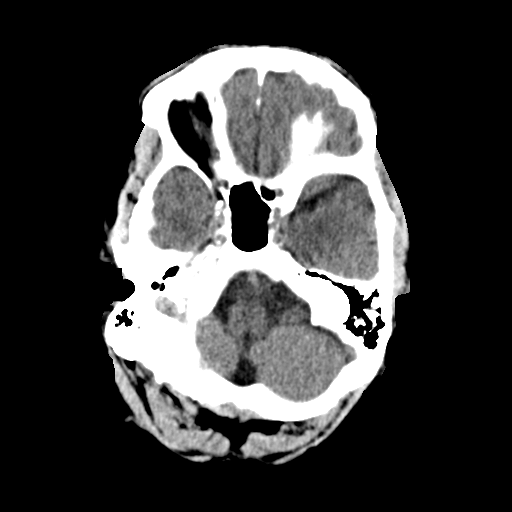
[im 8/31  brain]
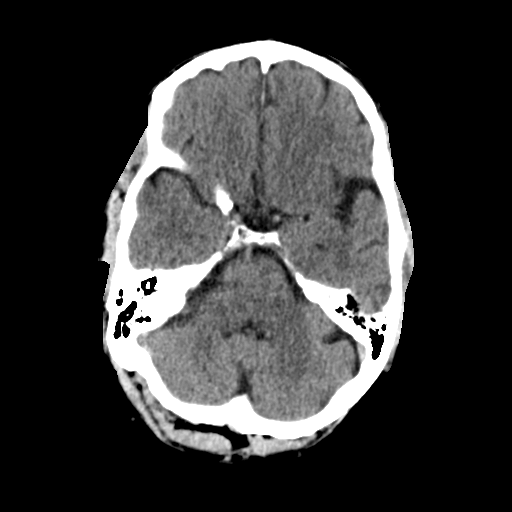
[im 10/31  brain]
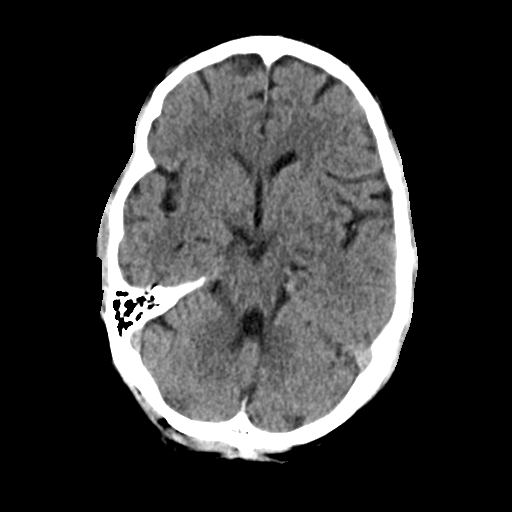
[im 10/31  bone]
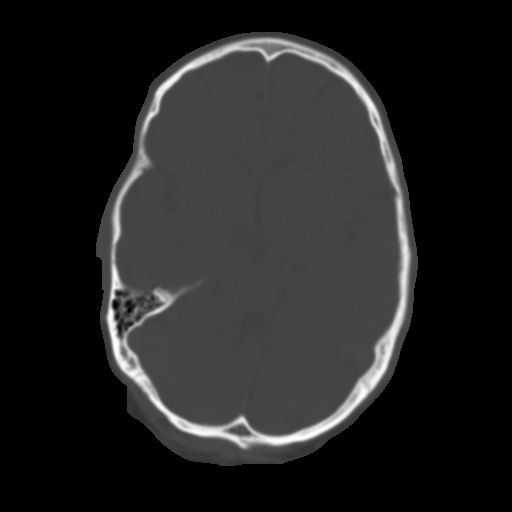
[im 12/31  brain]
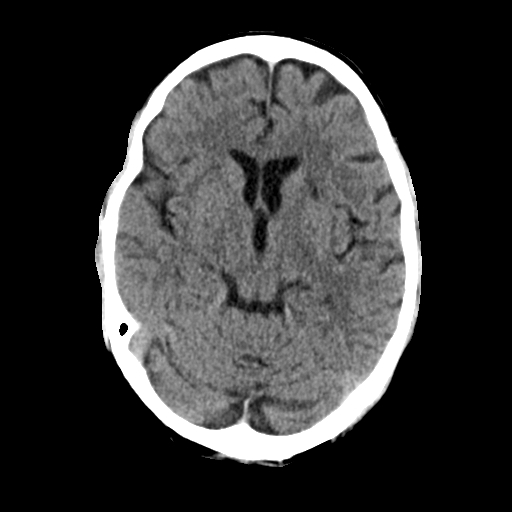
[im 14/31  brain]
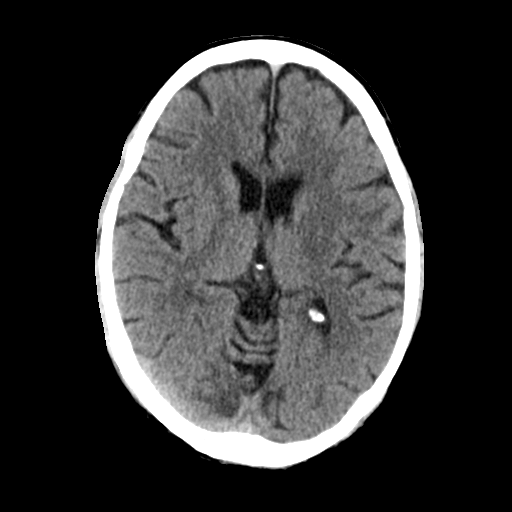
[im 16/31  brain]
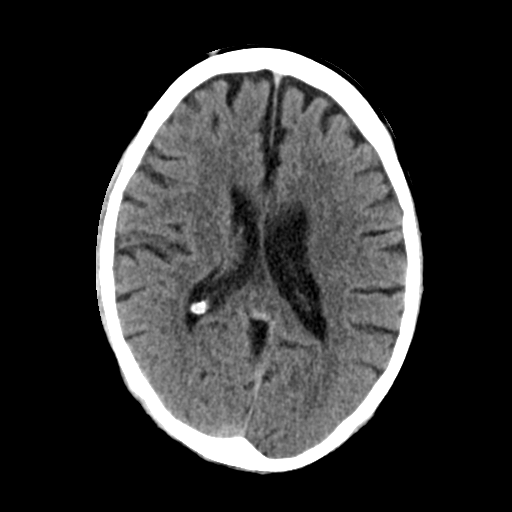
[im 17/31  brain]
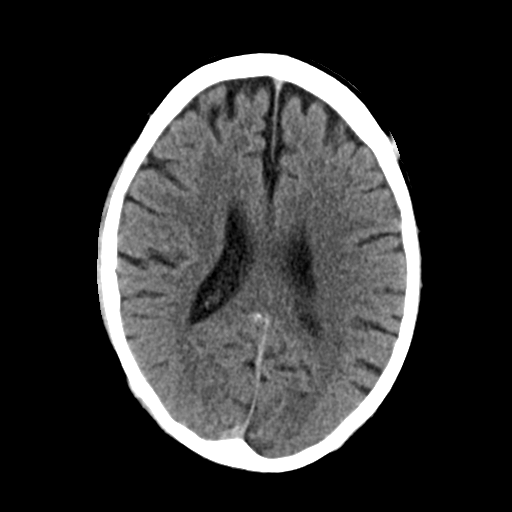
[im 17/31  bone]
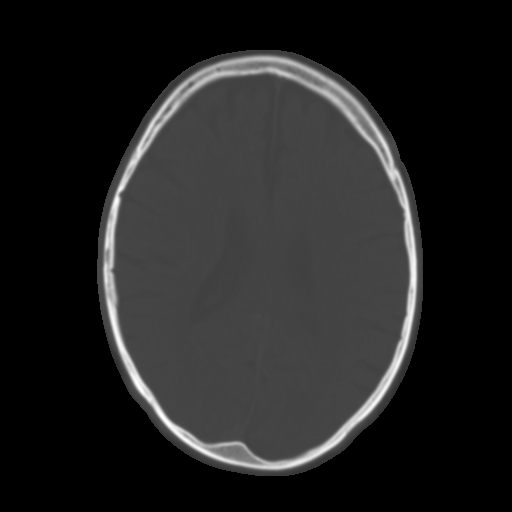
[im 19/31  brain]
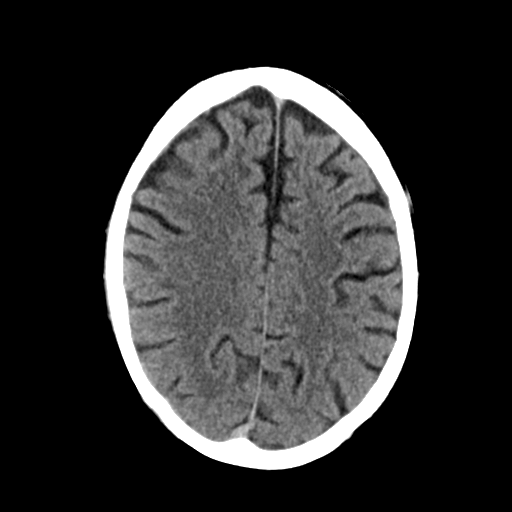
[im 21/31  brain]
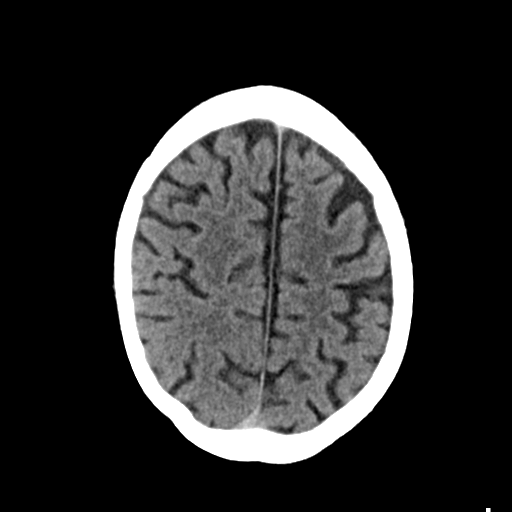
[im 23/31  brain]
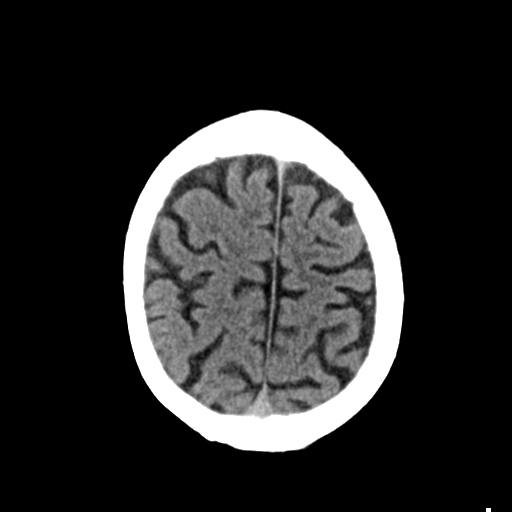
[im 25/31  brain]
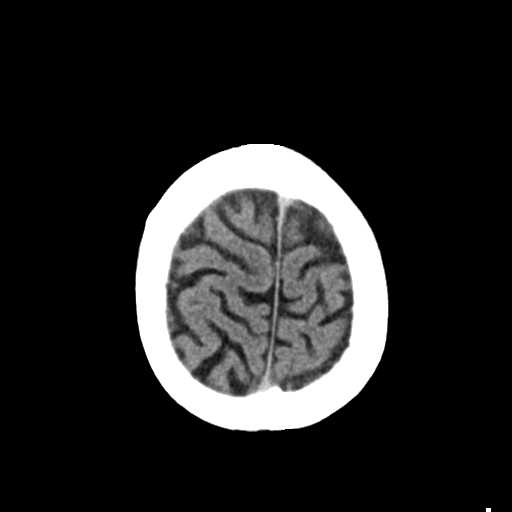
[im 25/31  bone]
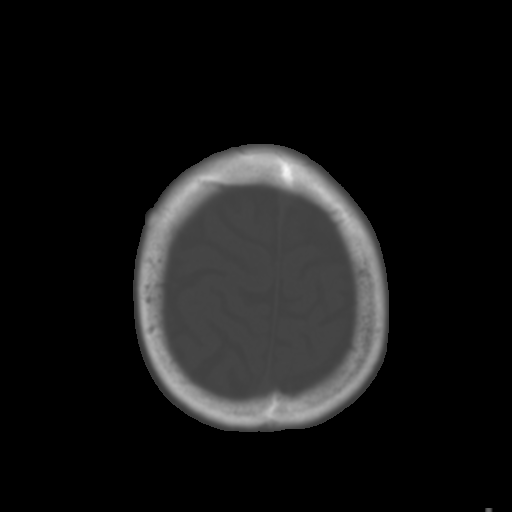
[im 27/31  brain]
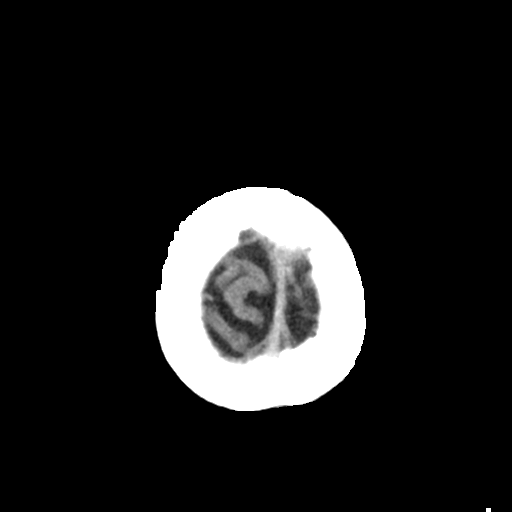
[im 29/31  brain]
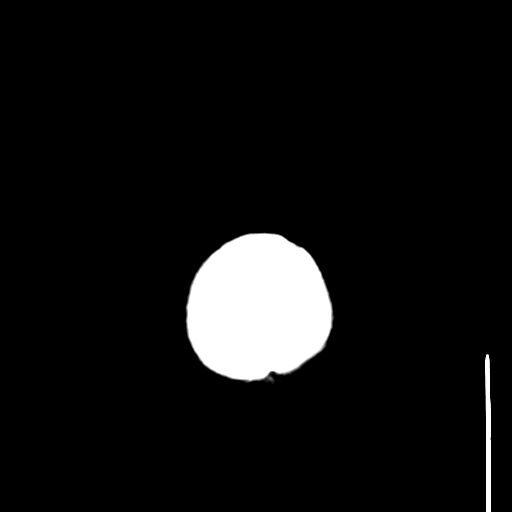

[15 of 30 positions shown; findings below may reference images not displayed]

FINDINGS: Brain: Slight head tilt in the gantry. Ventricular system normal in
size and appearance for age. Old lacunar strokes versus dilated
perivascular spaces in the LEFT basal ganglia. No mass lesion. No
midline shift. No acute hemorrhage or hematoma. No extra-axial fluid
collections. No evidence of acute infarction.

Vascular: Moderate BILATERAL carotid siphon and mild BILATERAL
vertebral artery atherosclerosis. No hyperdense vessel.

Skull: No skull fracture or other focal osseous abnormality
involving the skull.

Sinuses/Orbits: Visualized paranasal sinuses, bilateral mastoid air
cells and bilateral middle ear cavities well-aerated. Visualized
orbits and globes normal in appearance.

Other: None.
IMPRESSION: 1. No acute intracranial abnormality.
2. Old lacunar strokes versus dilated perivascular spaces in the
LEFT basal ganglia.

## 2021-12-26 ENCOUNTER — Encounter: Payer: Self-pay | Admitting: Nurse Practitioner

## 2022-01-04 ENCOUNTER — Other Ambulatory Visit: Payer: Self-pay | Admitting: Nurse Practitioner

## 2022-01-04 DIAGNOSIS — R7989 Other specified abnormal findings of blood chemistry: Secondary | ICD-10-CM

## 2022-01-04 NOTE — Telephone Encounter (Signed)
Requested Prescriptions  Pending Prescriptions Disp Refills  . levothyroxine (SYNTHROID) 25 MCG tablet [Pharmacy Med Name: LEVOTHYROXINE 25 MCG TABLET] 90 tablet 1    Sig: TAKE 1 TABLET BY MOUTH EVERY DAY     Endocrinology:  Hypothyroid Agents Failed - 01/04/2022  2:04 AM      Failed - TSH in normal range and within 360 days    TSH  Date Value Ref Range Status  07/10/2021 4.610 (H) 0.450 - 4.500 uIU/mL Final         Passed - Valid encounter within last 12 months    Recent Outpatient Visits          5 months ago Essential hypertension   [facility] Larae Grooms, NP   9 months ago Gout of foot, unspecified cause, unspecified chronicity, unspecified laterality   [facility] Larae Grooms, NP   9 months ago Subclinical hypothyroidism   [facility] Larae Grooms, NP   12 months ago Essential hypertension   [facility] Larae Grooms, NP   1 year ago Essential hypertension   [facility] Larae Grooms, NP      Future Appointments            In 2 weeks Larae Grooms, NP [facility]

## 2022-01-08 ENCOUNTER — Ambulatory Visit: Payer: Medicare Other | Admitting: Nurse Practitioner

## 2022-01-24 ENCOUNTER — Ambulatory Visit: Payer: Medicare Other | Admitting: Nurse Practitioner

## 2022-01-31 ENCOUNTER — Other Ambulatory Visit: Payer: Self-pay | Admitting: Nurse Practitioner

## 2022-01-31 DIAGNOSIS — R7989 Other specified abnormal findings of blood chemistry: Secondary | ICD-10-CM

## 2022-02-01 NOTE — Telephone Encounter (Signed)
Requested Prescriptions  Pending Prescriptions Disp Refills  . levothyroxine (SYNTHROID) 25 MCG tablet [Pharmacy Med Name: LEVOTHYROXINE 25 MCG TABLET] 90 tablet 0    Sig: TAKE 1 TABLET BY MOUTH EVERY DAY     Endocrinology:  Hypothyroid Agents Failed - 01/31/2022 10:35 AM      Failed - TSH in normal range and within 360 days    TSH  Date Value Ref Range Status  07/10/2021 4.610 (H) 0.450 - 4.500 uIU/mL Final         Passed - Valid encounter within last 12 months    Recent Outpatient Visits          6 months ago Essential hypertension   [facility] Larae Grooms, NP   10 months ago Gout of foot, unspecified cause, unspecified chronicity, unspecified laterality   [facility] Larae Grooms, NP   10 months ago Subclinical hypothyroidism   [facility] Larae G[facility], [facility]   1 year ago Essential hypertension   [facility] Larae Grooms, NP   1 year ago Essential hypertension   [facility] Larae Grooms, NP      Future Appointments            In 1 month Ok Edwards, Lillia Abed, PA-C [facility], [facility]

## 2022-03-07 ENCOUNTER — Other Ambulatory Visit: Payer: Self-pay | Admitting: Nurse Practitioner

## 2022-03-07 DIAGNOSIS — I10 Essential (primary) hypertension: Secondary | ICD-10-CM

## 2022-03-07 NOTE — Telephone Encounter (Signed)
Requested medication (s) are due for refill today: yes  Requested medication (s) are on the active medication list: yes  Last refill:  07/10/21  Future visit scheduled:no  Notes to clinic:  Unable to refill per protocol, appointment needed.      Requested Prescriptions  Pending Prescriptions Disp Refills   lisinopril (ZESTRIL) 40 MG tablet [Pharmacy Med Name: LISINOPRIL 40 MG TABLET] 90 tablet 1    Sig: TAKE 1 TABLET BY MOUTH EVERY DAY     Cardiovascular:  ACE Inhibitors Failed - 03/07/2022  2:11 AM      Failed - Cr in normal range and within 180 days    Creatinine, Ser  Date Value Ref Range Status  07/10/2021 1.02 0.76 - 1.27 mg/dL Final         Failed - K in normal range and within 180 days    Potassium  Date Value Ref Range Status  07/10/2021 4.4 3.5 - 5.2 mmol/L Final         Failed - Valid encounter within last 6 months    Recent Outpatient Visits           8 months ago Essential hypertension   [facility] Jon Billings, NP   11 months ago Gout of foot, unspecified cause, unspecified chronicity, unspecified laterality   [facility] Jon Billings, NP   11 months ago Subclinical hypothyroidism   [facility] Jon Billings, NP   1 year ago Essential hypertension   [facility] Jon Billings, NP   1 year ago Essential hypertension   [facility] Jon Billings, NP              Passed - Patient is not pregnant      Passed - Last BP in normal range    BP Readings from Last 1 Encounters:  07/10/21 134/87

## 2022-03-09 ENCOUNTER — Ambulatory Visit: Payer: Medicare Other | Admitting: Physician Assistant

## 2022-03-23 ENCOUNTER — Other Ambulatory Visit: Payer: Self-pay | Admitting: Nurse Practitioner

## 2022-03-23 DIAGNOSIS — I10 Essential (primary) hypertension: Secondary | ICD-10-CM

## 2022-03-23 NOTE — Telephone Encounter (Signed)
Call to patient- he had to change provider due to insurance- will send request back to pharmacy to forward to new provider Requested Prescriptions  Pending Prescriptions Disp Refills  . lisinopril (ZESTRIL) 40 MG tablet [Pharmacy Med Name: LISINOPRIL 40 MG TABLET] 90 tablet 1    Sig: TAKE 1 TABLET BY MOUTH EVERY DAY     Cardiovascular:  ACE Inhibitors Failed - 03/23/2022  1:19 AM      Failed - Cr in normal range and within 180 days    Creatinine, Ser  Date Value Ref Range Status  07/10/2021 1.02 0.76 - 1.27 mg/dL Final         Failed - K in normal range and within 180 days    Potassium  Date Value Ref Range Status  07/10/2021 4.4 3.5 - 5.2 mmol/L Final         Failed - Valid encounter within last 6 months    Recent Outpatient Visits          8 months ago Essential hypertension   [facility] Jon Billings, NP   11 months ago Gout of foot, unspecified cause, unspecified chronicity, unspecified laterality   [facility] Jon Billings, NP   1 year ago Subclinical hypothyroidism   [facility] Jon Billings, NP   1 year ago Essential hypertension   [facility], NP   1 year ago Essential hypertension   [facility] Jon Billings, NP             Passed - Patient is not pregnant      Passed - Last BP in normal range    BP Readings from Last 1 Encounters:  07/10/21 134/87

## 2022-04-03 ENCOUNTER — Other Ambulatory Visit: Payer: Self-pay | Admitting: Student

## 2022-04-03 DIAGNOSIS — R109 Unspecified abdominal pain: Secondary | ICD-10-CM

## 2022-04-08 ENCOUNTER — Other Ambulatory Visit: Payer: Self-pay | Admitting: Nurse Practitioner

## 2022-04-08 DIAGNOSIS — I10 Essential (primary) hypertension: Secondary | ICD-10-CM

## 2022-04-09 NOTE — Telephone Encounter (Signed)
Patient needs OV, will refill medication until OV is made. Patient needs OV for additional refills.  Requested Prescriptions  Pending Prescriptions Disp Refills   lisinopril (ZESTRIL) 40 MG tablet [Pharmacy Med Name: LISINOPRIL 40 MG TABLET] 90 tablet 0    Sig: TAKE 1 TABLET BY MOUTH EVERY DAY     Cardiovascular:  ACE Inhibitors Failed - 04/08/2022  9:01 AM      Failed - Cr in normal range and within 180 days    Creatinine, Ser  Date Value Ref Range Status  07/10/2021 1.02 0.76 - 1.27 mg/dL Final         Failed - K in normal range and within 180 days    Potassium  Date Value Ref Range Status  07/10/2021 4.4 3.5 - 5.2 mmol/L Final         Failed - Valid encounter within last 6 months    Recent Outpatient Visits           9 months ago Essential hypertension   [facility] Larae Grooms, NP   1 year ago Gout of foot, unspecified cause, unspecified chronicity, unspecified laterality   [facility] Larae Grooms, NP   1 year ago Subclinical hypothyroidism   [facility] Larae Grooms, NP   1 year ago Essential hypertension   [facility] Larae Grooms, NP   1 year ago Essential hypertension   [facility] Larae Grooms, NP              Passed - Patient is not pregnant      Passed - Last BP in normal range    BP Readings from Last 1 Encounters:  07/10/21 134/87

## 2022-04-17 ENCOUNTER — Other Ambulatory Visit: Payer: Self-pay

## 2022-04-26 ENCOUNTER — Ambulatory Visit
Admission: RE | Admit: 2022-04-26 | Discharge: 2022-04-26 | Disposition: A | Discharge status: Home / Self Care | Payer: Medicare HMO | Source: Ambulatory Visit | Attending: Student | Admitting: Student

## 2022-04-26 DIAGNOSIS — R109 Unspecified abdominal pain: Secondary | ICD-10-CM

## 2022-04-26 MED ORDER — IOPAMIDOL (ISOVUE-300) INJECTION 61%
100.0000 mL | Freq: Once | INTRAVENOUS | Status: AC | PRN
  Administered 2022-04-26: 100 mL via INTRAVENOUS

## 2022-04-30 ENCOUNTER — Other Ambulatory Visit: Payer: Self-pay | Admitting: Student

## 2022-04-30 DIAGNOSIS — R109 Unspecified abdominal pain: Secondary | ICD-10-CM

## 2022-05-04 ENCOUNTER — Other Ambulatory Visit: Payer: Self-pay | Admitting: Nurse Practitioner

## 2022-05-04 DIAGNOSIS — R7989 Other specified abnormal findings of blood chemistry: Secondary | ICD-10-CM

## 2022-05-04 NOTE — Telephone Encounter (Signed)
Requested Prescriptions  Pending Prescriptions Disp Refills   levothyroxine (SYNTHROID) 25 MCG tablet [Pharmacy Med Name: LEVOTHYROXINE 25 MCG TABLET] 90 tablet 0    Sig: TAKE 1 TABLET BY MOUTH EVERY DAY     Endocrinology:  Hypothyroid Agents Failed - 05/04/2022  1:46 AM      Failed - TSH in normal range and within 360 days    TSH  Date Value Ref Range Status  07/10/2021 4.610 (H) 0.450 - 4.500 uIU/mL Final         Passed - Valid encounter within last 12 months    Recent Outpatient Visits           9 months ago Essential hypertension   [facility] Larae Grooms, NP   1 year ago Gout of foot, unspecified cause, unspecified chronicity, unspecified laterality   [facility] Larae Grooms, NP   1 year ago Subclinical hypothyroidism   [facility] Larae Grooms, NP   1 year ago Essential hypertension   [facility] Larae Grooms, NP   1 year ago Essential hypertension   [facility] Larae Grooms, NP

## 2022-05-14 ENCOUNTER — Encounter: Payer: Self-pay | Admitting: Gastroenterology

## 2022-06-13 ENCOUNTER — Ambulatory Visit: Payer: Medicare HMO | Admitting: Gastroenterology

## 2022-06-13 ENCOUNTER — Encounter: Payer: Self-pay | Admitting: Gastroenterology

## 2022-06-13 VITALS — BP 158/102 | HR 78 | Ht 67.5 in | Wt 204.0 lb

## 2022-06-13 DIAGNOSIS — K5792 Diverticulitis of intestine, part unspecified, without perforation or abscess without bleeding: Secondary | ICD-10-CM | POA: Diagnosis not present

## 2022-06-13 NOTE — Progress Notes (Signed)
06/13/2022 Brad Flores [id number] November 11, 1955   HISTORY OF PRESENT ILLNESS:  This is a 67 year old male who is new to our office.  He is here today to discuss with with diverticulitis.  He tells me that he has episodes of left-sided abdominal pain with associated abdominal bloating intermittently dating back probably 7 or 8 years.  Last episode in December at which time he had a CT scan confirming diverticulitis.  Was not treated with antibiotics and has never been treated with antibiotics.  He reports going on a liquid diet and drinking lots of water when these episodes occur and symptoms dissipate.  He reports colonoscopy about 7 years or so ago in [place].  We do not have those records.  He will look through his files and see if he can find those records for [place].  He reports being told 10 years before he needed another.  Otherwise no other issues or complaints.  CT scan abdomen and pelvis with contrast 04/27/2022:  Mild-to-moderate proximal sigmoid diverticulitis.   Past Medical History:  Diagnosis Date   COVID-19 11/2019   Hyperlipidemia    Hypertension    Hypothyroid    Past Surgical History:  Procedure Laterality Date   ACHILLES TENDON LENGTHENING     APPENDECTOMY     CARPAL TUNNEL RELEASE Right    HAND TENDON SURGERY     HERNIA REPAIR     KNEE SURGERY     ROTATOR CUFF REPAIR      reports that he has never smoked. He has never used smokeless tobacco. He reports current alcohol use. He reports that he does not use drugs. family history includes Diabetes in his mother; Heart disease in his father and mother. No Known Allergies    Outpatient Encounter Medications as of 06/13/2022  Medication Sig   atorvastatin (LIPITOR) 80 MG tablet Take 1 tablet (80 mg total) by mouth daily.   levothyroxine (SYNTHROID) 25 MCG tablet TAKE 1 TABLET BY MOUTH EVERY DAY   lisinopril (ZESTRIL) 40 MG tablet TAKE 1 TABLET BY MOUTH EVERY DAY   [DISCONTINUED] aspirin EC 81 MG tablet Take 1  tablet (81 mg total) by mouth daily. Swallow whole. (Patient not taking: Reported on 06/13/2022)   No facility-administered encounter medications on file as of 06/13/2022.    REVIEW OF SYSTEMS  : All other systems reviewed and negative except where noted in the History of Present Illness.   PHYSICAL EXAM: BP (!) 158/102 (BP Location: Left Arm, Patient Position: Sitting, Cuff Size: Large)   Pulse 78   Ht 5' 7.5" (1.715 m)   Wt 204 lb (92.5 kg)   SpO2 96%   BMI 31.48 kg/m  General: Well developed white male in no acute distress Head: Normocephalic and atraumatic Eyes:  Sclerae anicteric, conjunctiva pink. Ears: Normal auditory acuity Lungs: Clear throughout to auscultation; no W/R/R. Heart: Regular rate and rhythm; no M/R/G. Abdomen: Soft, non-distended.  BS present.  Non-tender. Musculoskeletal: Symmetrical with no gross deformities  Skin: No lesions on visible extremities Extremities: No edema  Neurological: Alert oriented x 4, grossly non-focal Psychological:  Alert and cooperative. Normal mood and affect  ASSESSMENT AND PLAN: *Diverticulitis: Has episodes of left-sided abdominal pain with associated abdominal bloating intermittently.  Last episode in December at which time he had a CT scan confirming diverticulitis.  Was not treated with antibiotics and has never been treated with antibiotics.  He reports going on a liquid diet and drinking lots of water when these  episodes occur and symptoms dissipate.  He reports colonoscopy about 7 years or so ago in [place].  We do not have those records.  He will look through his files and see if he can find those records for [place].  He reports being told 10 years before he needed another.  We discussed possibly repeating the colonoscopy.  He would like to see if he can find those records at home and will get back with [place] later today or later this week in regards to this.  If he cannot find them then he is agreeable to consider repeat  colonoscopy.   CC:  Jon Billings, NP

## 2022-06-13 NOTE — Patient Instructions (Addendum)
If your blood pressure at your visit was 140/90 or greater, please contact your primary care physician to follow up on this. _______________________________________________________  If you are age 68 or older, your body mass index should be between 23-30. Your Body mass index is 31.48 kg/m. If this is out of the aforementioned range listed, please consider follow up with your Primary Care Provider. _______________________________________________________  Please look for colonoscopy results and let us know if you find them.  If you do not find them, we will schedule a colonoscopy.  The [facility] providers would like to encourage you to use [facility] to communicate with providers for non-urgent requests or questions.  Due to long hold times on the telephone, sending your provider a message by [facility] may be a faster and more efficient way to get a response.  Please allow 48 business hours for a response.  Please remember that this is for non-urgent requests.  _______________________________________________________  Thank you for entrusting me with your care and choosing [facility].  Alonza Bogus, PA-C

## 2022-06-14 ENCOUNTER — Telehealth: Payer: Self-pay

## 2022-06-14 DIAGNOSIS — K5792 Diverticulitis of intestine, part unspecified, without perforation or abscess without bleeding: Secondary | ICD-10-CM

## 2022-06-14 NOTE — Telephone Encounter (Signed)
Brad Flores can the pt be done in the [facility]?

## 2022-06-14 NOTE — Telephone Encounter (Signed)
No I just wanted to confirm it was ok.

## 2022-06-14 NOTE — Telephone Encounter (Signed)
-----  Message from Algernon Huxley, RN sent at 06/14/2022  1:39 PM EST ----- Rebeckah Masih see addendum from [place]. This pt saw Janett Billow 06/13/22 ----- Message ----- From: Thornton Park, MD Sent: 06/14/2022  11:14 AM EST To: Carl Best, RN     ----- Message ----- From: Ritta Slot Sent: 06/13/2022  12:51 PM EST To: Thornton Park, MD

## 2022-06-14 NOTE — Telephone Encounter (Signed)
Thornton Park, MD at 06/13/2022  9:30 AM  Status: Signed  Reviewed and agree with management plans. Colonoscopy recommended given the recurrent diverticulitis with last colonoscopy >2 years ago.   Kimberly L. Tarri Glenn, MD, MPH

## 2022-06-14 NOTE — Progress Notes (Signed)
Patty this pt saw Janett Billow 06/13/22. See note regarding colon.

## 2022-06-14 NOTE — Progress Notes (Signed)
Reviewed and agree with management plans. Colonoscopy recommended given the recurrent diverticulitis with last colonoscopy >2 years ago.  [place] Cen L. Tarri Glenn, MD, MPH

## 2022-06-15 MED ORDER — NA SULFATE-K SULFATE-MG SULF 17.5-3.13-1.6 GM/177ML PO SOLN: 1.0000 | Freq: Once | ORAL | 0 refills | Status: AC

## 2022-06-15 NOTE — Telephone Encounter (Signed)
The pt has been advised and scheduled for colon with Dr Tarri Glenn in the [facility] on 3/1.  He says he did try to find records but has been unsuccessful.

## 2022-06-24 ENCOUNTER — Telehealth: Payer: Self-pay | Admitting: Gastroenterology

## 2022-06-24 MED ORDER — METRONIDAZOLE 250 MG PO TABS: 250.0000 mg | ORAL_TABLET | Freq: Three times a day (TID) | ORAL | 0 refills | Status: DC

## 2022-06-24 MED ORDER — CIPROFLOXACIN HCL 500 MG PO TABS: 500.0000 mg | ORAL_TABLET | Freq: Two times a day (BID) | ORAL | 0 refills | Status: DC

## 2022-06-24 NOTE — Telephone Encounter (Signed)
Patient called with complaints of abdominal pain, etc. that have been bothering him through the weekend.  Had diverticulitis on the CT scan late last year, but was never treated.  When I saw him in the office earlier this month he was nontender on exam.  Will plan to treat with Cipro and Flagyl for a week.  He is scheduled at the beginning of March for colonoscopy.  I am going to have my nurse reach out to him to push that out a little bit further may be mid or end of March just to be sure that all of this is healed and going in the right direction first.  Patty, please contact him on Monday.

## 2022-06-25 NOTE — Telephone Encounter (Signed)
Thanks for your help.  KLB 

## 2022-06-25 NOTE — Telephone Encounter (Signed)
I have spoken to the pt and rescheduled him to 3/19 at 130 pm. We have discussed the new date and time instructions. No further questions or concerns.

## 2022-07-12 ENCOUNTER — Other Ambulatory Visit: Payer: Self-pay | Admitting: Nurse Practitioner

## 2022-07-12 DIAGNOSIS — I10 Essential (primary) hypertension: Secondary | ICD-10-CM

## 2022-07-12 NOTE — Telephone Encounter (Signed)
Requested medication (s) are due for refill today:   Yes  Requested medication (s) are on the active medication list:   Yes  Future visit scheduled:   No   He has cancelled his last several appts.   Last ordered: 04/09/2022 #90, 0 refills  Returned because all labs are due per protocol and an OV needed.   Has cancelled last several appts.   Requested Prescriptions  Pending Prescriptions Disp Refills   lisinopril (ZESTRIL) 40 MG tablet [Pharmacy Med Name: LISINOPRIL 40 MG TABLET] 90 tablet 0    Sig: TAKE 1 TABLET BY MOUTH EVERY DAY     Cardiovascular:  ACE Inhibitors Failed - 07/12/2022  1:27 AM      Failed - Cr in normal range and within 180 days    Creatinine, Ser  Date Value Ref Range Status  07/10/2021 1.02 0.76 - 1.27 mg/dL Final         Failed - K in normal range and within 180 days    Potassium  Date Value Ref Range Status  07/10/2021 4.4 3.5 - 5.2 mmol/L Final         Failed - Last BP in normal range    BP Readings from Last 1 Encounters:  06/13/22 (!) 158/102         Failed - Valid encounter within last 6 months    Recent Outpatient Visits           1 year ago Essential hypertension   [place] Jon Billings, NP   1 year ago Gout of foot, unspecified cause, unspecified chronicity, unspecified laterality   [place] Jon Billings, NP   1 year ago Subclinical hypothyroidism   [place] Jon Billings, NP   1 year ago Essential hypertension   [place] Jon Billings, NP   1 year ago Essential hypertension   [place], NP              Passed - Patient is not pregnant

## 2022-07-27 ENCOUNTER — Encounter: Payer: Medicare HMO | Admitting: Gastroenterology

## 2022-08-03 ENCOUNTER — Other Ambulatory Visit: Payer: Self-pay | Admitting: Nurse Practitioner

## 2022-08-03 DIAGNOSIS — R7989 Other specified abnormal findings of blood chemistry: Secondary | ICD-10-CM

## 2022-08-03 NOTE — Telephone Encounter (Signed)
Requested medication (s) are due for refill today: yes  Requested medication (s) are on the active medication list: yes  Last refill:  05/04/22  Future visit scheduled: no  Notes to clinic:  Unable to refill per protocol, courtesy refill already given, routing for provider approval.      Requested Prescriptions  Pending Prescriptions Disp Refills   levothyroxine (SYNTHROID) 25 MCG tablet [Pharmacy Med Name: LEVOTHYROXINE 25 MCG TABLET] 90 tablet 0    Sig: TAKE 1 [place]     Endocrinology:  Hypothyroid Agents Failed - 08/03/2022  1:43 AM      Failed - TSH in normal range and within 360 days    TSH  Date Value Ref Range Status  07/10/2021 4.610 (H) 0.450 - 4.500 uIU/mL Final         Failed - Valid encounter within last 12 months    Recent Outpatient Visits           1 year ago Essential hypertension   [place] Jon Billings, NP   1 year ago Gout of foot, unspecified cause, unspecified chronicity, unspecified laterality   [place] Jon Billings, NP   1 year ago Subclinical hypothyroidism   [place] Jon Billings, NP   1 year ago Essential hypertension   [place] Jon Billings, NP   1 year ago Essential hypertension   [place] Jon Billings, NP

## 2022-08-06 ENCOUNTER — Encounter: Payer: Self-pay | Admitting: Gastroenterology

## 2022-08-14 ENCOUNTER — Ambulatory Visit (AMBULATORY_SURGERY_CENTER): Payer: Medicare HMO | Admitting: Gastroenterology

## 2022-08-14 ENCOUNTER — Encounter: Payer: Self-pay | Admitting: Gastroenterology

## 2022-08-14 VITALS — BP 122/74 | HR 72 | Temp 97.3°F | Resp 12 | Ht 67.0 in | Wt 204.0 lb

## 2022-08-14 DIAGNOSIS — K5792 Diverticulitis of intestine, part unspecified, without perforation or abscess without bleeding: Secondary | ICD-10-CM | POA: Diagnosis not present

## 2022-08-14 DIAGNOSIS — D122 Benign neoplasm of ascending colon: Secondary | ICD-10-CM

## 2022-08-14 DIAGNOSIS — D124 Benign neoplasm of descending colon: Secondary | ICD-10-CM

## 2022-08-14 DIAGNOSIS — D120 Benign neoplasm of cecum: Secondary | ICD-10-CM

## 2022-08-14 DIAGNOSIS — K635 Polyp of colon: Secondary | ICD-10-CM

## 2022-08-14 MED ORDER — SODIUM CHLORIDE 0.9 % IV SOLN: 500.0000 mL | Freq: Once | INTRAVENOUS | Status: DC

## 2022-08-14 NOTE — Patient Instructions (Signed)
Resume previous diet (see High fiber diet information attached) Continue present medications (add psyllium-fiber supplement) Await pathology results  Handouts given for diverticulosis, high fiber diet/fiber supplement and polyps.  YOU HAD AN ENDOSCOPIC PROCEDURE TODAY AT [facility]:   Refer to the procedure report that was given to you for any specific questions about what was found during the examination.  If the procedure report does not answer your questions, please call your gastroenterologist to clarify.  If you requested that your care partner not be given the details of your procedure findings, then the procedure report has been included in a sealed envelope for you to review at your convenience later.  YOU SHOULD EXPECT: Some feelings of bloating in the abdomen. Passage of more gas than usual.  Walking can help get rid of the air that was put into your GI tract during the procedure and reduce the bloating. If you had a lower endoscopy (such as a colonoscopy or flexible sigmoidoscopy) you may notice spotting of blood in your stool or on the toilet paper. If you underwent a bowel prep for your procedure, you may not have a normal bowel movement for a few days.  Please Note:  You might notice some irritation and congestion in your nose or some drainage.  This is from the oxygen used during your procedure.  There is no need for concern and it should clear up in a day or so.  SYMPTOMS TO REPORT IMMEDIATELY:  Following lower endoscopy (colonoscopy):  Excessive amounts of blood in the stool  Significant tenderness or worsening of abdominal pains  Swelling of the abdomen that is new, acute  Fever of 100F or higher  For urgent or emergent issues, a gastroenterologist can be reached at any hour by calling [phone]. Do not use MyChart messaging for urgent concerns.   DIET:  We do recommend a small meal at first, but then you may proceed to your regular diet.  Drink plenty  of fluids but you should avoid alcoholic beverages for 24 hours.  ACTIVITY:  You should plan to take it easy for the rest of today and you should NOT DRIVE or use heavy machinery until tomorrow (because of the sedation medicines used during the test).    FOLLOW UP: Our staff will call the number listed on your records the next business day following your procedure.  We will call around 7:15- 8:00 am to check on you and address any questions or concerns that you may have regarding the information given to you following your procedure. If we do not reach you, we will leave a message.     If any biopsies were taken you will be contacted by phone or by letter within the next 1-3 weeks.  Please call us at [phone] if you have not heard about the biopsies in 3 weeks.    SIGNATURES/CONFIDENTIALITY: You and/or your care partner have signed paperwork which will be entered into your electronic medical record.  These signatures attest to the fact that that the information above on your After Visit Summary has been reviewed and is understood.  Full responsibility of the confidentiality of this discharge information lies with you and/or your care-partner.

## 2022-08-14 NOTE — Progress Notes (Signed)
Sedate, gd SR, tolerated procedure well, VSS, report to RN 

## 2022-08-14 NOTE — Progress Notes (Signed)
   Referring Provider: Jon Billings, NP Primary Care Physician:  Jon Billings, NP  Indication for Colonoscopy: Recent diverticulitis  IMPRESSION:  Recent diverticulitis Need for colon cancer screening Appropriate candidate for monitored anesthesia care  PLAN: Colonoscopy in the [place] today   HPI: Brad Flores is a 67 y.o. male presents for screening colonoscopy after a diagnosis of acute diverticulitis.  Prior colonoscopy in [place] 7 years ago  No known family history of colon cancer or polyps. No family history of uterine/endometrial cancer, pancreatic cancer or gastric/stomach cancer.   Past Medical History:  Diagnosis Date   COVID-19 11/2019   Hyperlipidemia    Hypertension    Hypothyroid     Past Surgical History:  Procedure Laterality Date   ACHILLES TENDON LENGTHENING     APPENDECTOMY     CARPAL TUNNEL RELEASE Right    HAND TENDON SURGERY     HERNIA REPAIR     KNEE SURGERY     ROTATOR CUFF REPAIR      Current Outpatient Medications  Medication Sig Dispense Refill   atorvastatin (LIPITOR) 80 MG tablet Take 1 tablet (80 mg total) by mouth daily. 90 tablet 3   indomethacin (INDOCIN SR) 75 MG CR capsule Take by mouth.     levothyroxine (SYNTHROID) 25 MCG tablet TAKE 1 TABLET BY MOUTH EVERY DAY 90 tablet 0   lisinopril (ZESTRIL) 40 MG tablet TAKE 1 TABLET BY MOUTH EVERY DAY 90 tablet 0   Current Facility-Administered Medications  Medication Dose Route Frequency Provider Last Rate Last Admin   0.9 %  sodium chloride infusion  500 mL Intravenous Once Thornton Park, MD        Allergies as of 08/14/2022   (No Known Allergies)    Family History  Problem Relation Age of Onset   Heart disease Mother    Diabetes Mother    Heart disease Father    Colon cancer Neg Hx    Esophageal cancer Neg Hx    Rectal cancer Neg Hx    Stomach cancer Neg Hx      Physical Exam: General:   Alert,  well-nourished, pleasant and cooperative in NAD Head:   Normocephalic and atraumatic. Eyes:  Sclera clear, no icterus.   Conjunctiva pink. Mouth:  No deformity or lesions.   Neck:  Supple; no masses or thyromegaly. Lungs:  Clear throughout to auscultation.   No wheezes. Heart:  Regular rate and rhythm; no murmurs. Abdomen:  Soft, non-tender, nondistended, normal bowel sounds, no rebound or guarding.  Msk:  Symmetrical. No boney deformities LAD: No inguinal or umbilical LAD Extremities:  No clubbing or edema. Neurologic:  Alert and  oriented x4;  grossly nonfocal Skin:  No obvious rash or bruise. Psych:  Alert and cooperative. Normal mood and affect.     Studies/Results: No results found.    Leisa Gault L. Tarri Glenn, MD, MPH 08/14/2022, 1:24 PM

## 2022-08-14 NOTE — Progress Notes (Signed)
Called to room to assist during endoscopic procedure.  Patient ID and intended procedure confirmed with present staff. Received instructions for my participation in the procedure from the performing physician.  

## 2022-08-14 NOTE — Op Note (Signed)
[place] Patient Name: Brad Flores Procedure Date: 08/14/2022 1:38 PM MRN: [id number] Endoscopist: Thornton Park MD, MD, [id number] Age: 67 Referring MD:  Date of Birth: 02/14/1956 Gender: Male Account #: [id number] Procedure:                Colonoscopy Indications:              Follow-up of diverticulitis                           Last colonoscopy 7 years ago in [place]:                Monitored Anesthesia Care Procedure:                Pre-Anesthesia Assessment:                           - Prior to the procedure, a History and Physical                            was performed, and patient medications and                            allergies were reviewed. The patient's tolerance of                            previous anesthesia was also reviewed. The risks                            and benefits of the procedure and the sedation                            options and risks were discussed with the patient.                            All questions were answered, and informed consent                            was obtained. Prior Anticoagulants: The patient has                            taken no anticoagulant or antiplatelet agents. ASA                            Grade Assessment: II - A patient with mild systemic                            disease. After reviewing the risks and benefits,                            the patient was deemed in satisfactory condition to                            undergo the procedure.  After obtaining informed consent, the colonoscope                            was passed under direct vision. Throughout the                            procedure, the patient's blood pressure, pulse, and                            oxygen saturations were monitored continuously. The                            Olympus SN [id number] was introduced through the anus                            and advanced to the 3 cm into the  ileum. A second                            forward view of the right colon was performed. The                            colonoscopy was performed without difficulty. The                            patient tolerated the procedure well. The quality                            of the bowel preparation was excellent. The                            terminal ileum, ileocecal valve, appendiceal                            orifice, and rectum were photographed. Scope In: 1:45:59 PM Scope Out: 2:06:55 PM Scope Withdrawal Time: 0 hours 18 minutes 26 seconds  Total Procedure Duration: 0 hours 20 minutes 56 seconds  Findings:                 The perianal and digital rectal examinations were                            normal.                           Multiple medium-mouthed and small-mouthed                            diverticula were found in the entire colon.                           Two sessile polyps were found in the descending                            colon and ascending colon. The polyps were 2 to 3  mm in size. These polyps were removed with a cold                            snare. Resection and retrieval were complete.                            Estimated blood loss was minimal.                           Two flat polyps were found in the cecum. The polyps                            were 2 to 3 mm in size. These polyps were removed                            with a cold snare. Resection and retrieval were                            complete. Estimated blood loss was minimal.                           The exam was otherwise without abnormality on                            direct and retroflexion views. Complications:            No immediate complications. Estimated Blood Loss:     Estimated blood loss was minimal. Impression:               - Diverticulosis in the entire examined colon.                           - Two 2 to 3 mm polyps in the descending colon and                             in the ascending colon, removed with a cold snare.                            Resected and retrieved.                           - Two 2 to 3 mm polyps in the cecum, removed with a                            cold snare. Resected and retrieved.                           - The examination was otherwise normal on direct                            and retroflexion views. Recommendation:           - Patient has a contact number available for  emergencies. The signs and symptoms of potential                            delayed complications were discussed with the                            patient. Return to normal activities tomorrow.                            Written discharge instructions were provided to the                            patient.                           - Continue present medications.                           - Await pathology results.                           - Repeat colonoscopy date to be determined after                            pending pathology results are reviewed for                            surveillance.                           - Follow a high fiber diet. Drink at least 64                            ounces of water daily. Add a daily stool bulking                            agent such as psyllium (an exampled would be                            Metamucil).                           - Emerging evidence supports eating a diet of                            fruits, vegetables, grains, calcium, and yogurt                            while reducing red meat and alcohol may reduce the                            risk of colon cancer.                           - Thank you for allowing me to be involved in your  colon cancer prevention. Thornton Park MD, MD 08/14/2022 2:18:43 PM This report has been signed electronically.

## 2022-08-15 ENCOUNTER — Telehealth: Payer: Self-pay

## 2022-08-15 NOTE — Telephone Encounter (Signed)
  Follow up Call-     08/14/2022   12:58 PM  Call back number  Post procedure Call Back phone  # [phone]  Permission to leave phone message Yes     Patient questions:  Do you have a fever, pain , or abdominal swelling? No. Pain Score  0 *  Have you tolerated food without any problems? Yes.    Have you been able to return to your normal activities? Yes.    Do you have any questions about your discharge instructions: Diet   No. Medications  No. Follow up visit  No.  Do you have questions or concerns about your Care? No.  Actions: * If pain score is 4 or above: No action needed, pain <4.

## 2022-08-22 ENCOUNTER — Other Ambulatory Visit: Payer: Self-pay | Admitting: Nurse Practitioner

## 2022-08-22 DIAGNOSIS — R7989 Other specified abnormal findings of blood chemistry: Secondary | ICD-10-CM

## 2022-08-22 NOTE — Telephone Encounter (Signed)
Requested by interface surescripts. Called patient to confirm for appt and pt reports he is no longer under prescribers care due to change in insurance to [facility].  Requested Prescriptions  Refused Prescriptions Disp Refills   levothyroxine (SYNTHROID) 25 MCG tablet [Pharmacy Med Name: LEVOTHYROXINE 25 MCG TABLET] 90 tablet 0    Sig: TAKE 1 TABLET BY MOUTH EVERY DAY     Endocrinology:  Hypothyroid Agents Failed - 08/22/2022  9:52 AM      Failed - TSH in normal range and within 360 days    TSH  Date Value Ref Range Status  07/10/2021 4.610 (H) 0.450 - 4.500 uIU/mL Final         Failed - Valid encounter within last 12 months    Recent Outpatient Visits           1 year ago Essential hypertension   [place] Jon Billings, NP   1 year ago Gout of foot, unspecified cause, unspecified chronicity, unspecified laterality   [place] Jon Billings, NP   1 year ago Subclinical hypothyroidism   [place] Jon Billings, NP   1 year ago Essential hypertension   [place] Jon Billings, NP   1 year ago Essential hypertension   [place] Jon Billings, NP

## 2022-08-22 NOTE — Telephone Encounter (Signed)
Called patient to schedule appt for medication refills. Patient reports he has switched insurances and now has [employer] and [employer] out of network. Patient had to find another provider to meet insurance needs.

## 2022-08-26 IMAGING — DX DG ANKLE COMPLETE 3+V*R*
3 series · 3 of 3 positions shown · non-contrast
Comparison: None.

CLINICAL DATA: 65 year old male with right ankle pain for 3 days
but no known injury. Unable to weightbear.

EXAM:
RIGHT ANKLE - COMPLETE 3+ VIEW

[ankle ap]
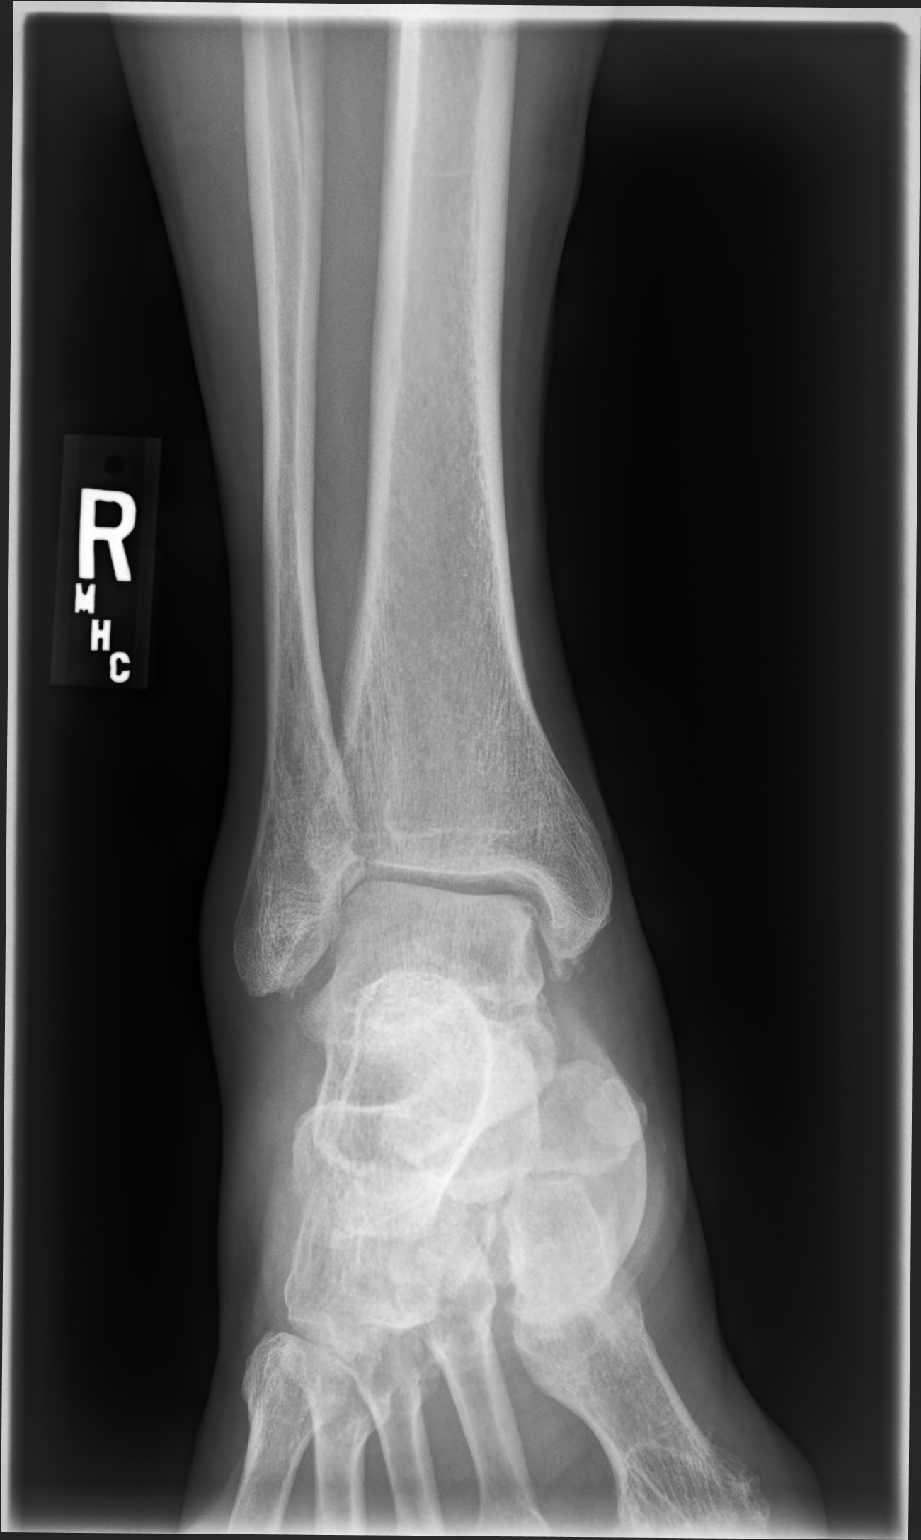

[ankle mlo]
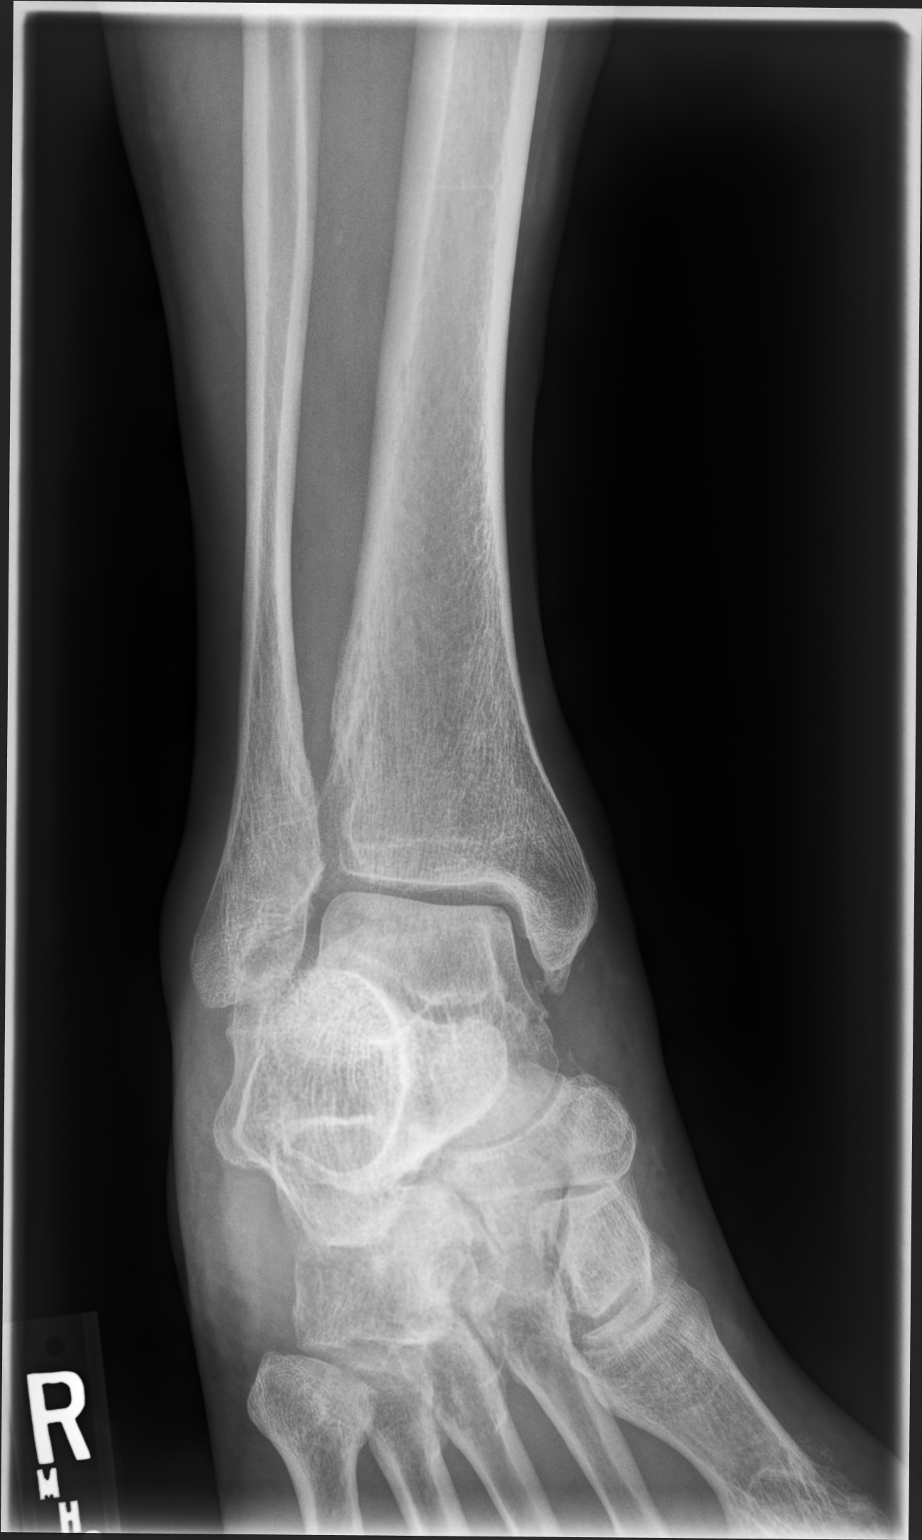

[ankle lat]
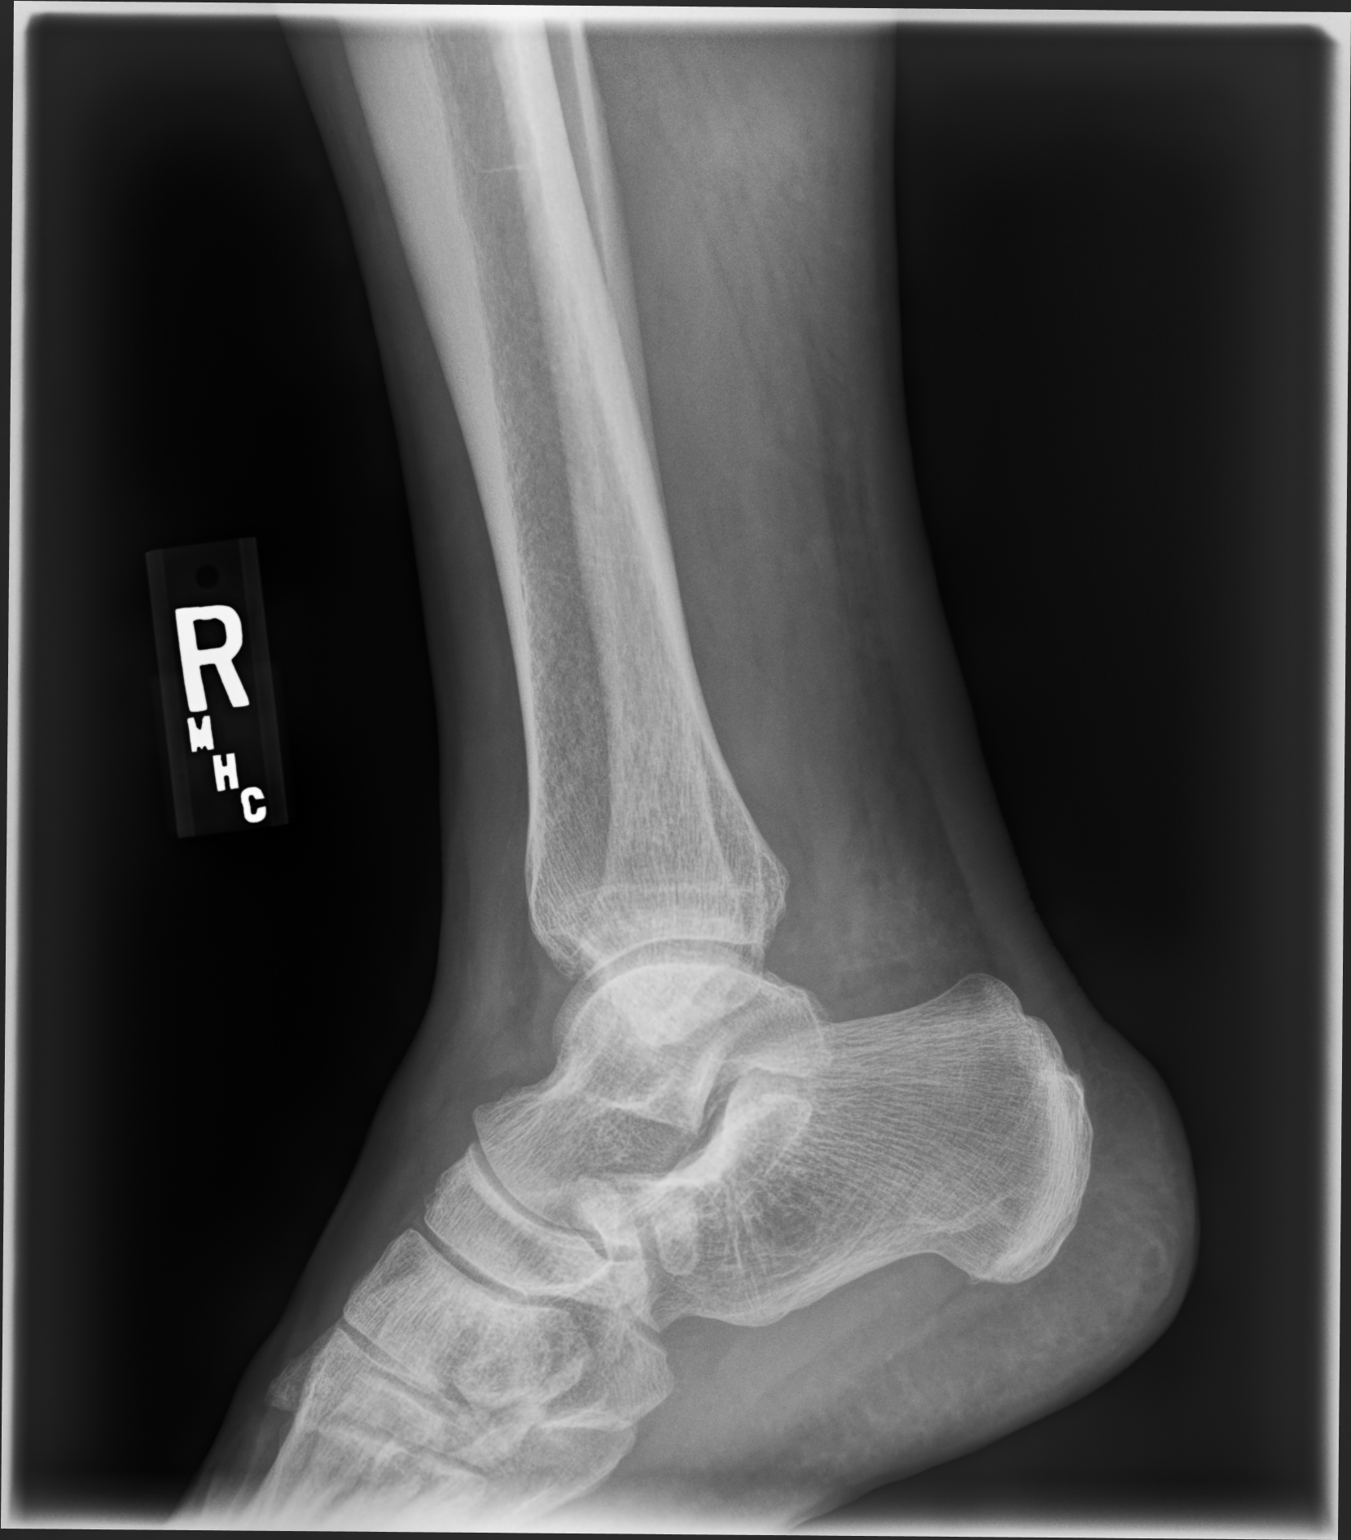

[3 of 3 positions shown; findings below may reference images not displayed]

FINDINGS: Mortise joint alignment preserved. Talar dome intact. Small chronic
appearing ossific fragments at both the medial and lateral
malleolus. No acute fracture of the distal tibia or fibula
identified. Talus and calcaneus appear intact. But possible joint
effusion is present on the lateral, along with mild anterior and
lateral soft tissue swelling at the ankle. Visible bones of the
right foot appear intact.
IMPRESSION: Mild soft tissue swelling and possible ankle joint effusion. Small
chronic ossific fragments at the malleoli, but no acute fracture or
dislocation identified.

## 2022-08-27 ENCOUNTER — Encounter: Payer: Self-pay | Admitting: Gastroenterology

## 2023-08-12 ENCOUNTER — Encounter: Payer: Self-pay | Admitting: Podiatry

## 2023-08-12 ENCOUNTER — Ambulatory Visit (INDEPENDENT_AMBULATORY_CARE_PROVIDER_SITE_OTHER)

## 2023-08-12 ENCOUNTER — Ambulatory Visit: Admitting: Podiatry

## 2023-08-12 DIAGNOSIS — L03115 Cellulitis of right lower limb: Secondary | ICD-10-CM

## 2023-08-12 DIAGNOSIS — M205X1 Other deformities of toe(s) (acquired), right foot: Secondary | ICD-10-CM

## 2023-08-12 MED ORDER — MELOXICAM 15 MG PO TABS: 15.0000 mg | ORAL_TABLET | Freq: Every day | ORAL | 0 refills | Status: AC

## 2023-08-12 NOTE — Progress Notes (Signed)
  Subjective:  Patient ID: Brad Flores, male    DOB: [birth year],   MRN: [id number]  Chief Complaint  Patient presents with   Foot Pain    Pt presents for right foot pain that has been going on for a while.    68 y.o. male presents for concern of right foot pain that has been ongoing for over a year. Relates he gets flares of pain in the toe. Denies any redeness or swelling. Relates he has been doing better because he has been doing a no sugar diet and intermittent fasting that he believes has really helped. He relates pain tends to be worse the more he is on the foot. Currently denies any treatments. Has had indomethacin but does not feel the need to use.  Denies any other pedal complaints. Denies n/v/f/c.   Past Medical History:  Diagnosis Date   COVID-19 11/2019   Hyperlipidemia    Hypertension    Hypothyroid     Objective:  Physical Exam: Vascular: DP/PT pulses 2/4 bilateral. CFT <3 seconds. Normal hair growth on digits. No edema.  Skin. No lacerations or abrasions bilateral feet.  Musculoskeletal: MMT 5/5 bilateral lower extremities in DF, PF, Inversion and Eversion. Deceased ROM in DF of ankle joint. Tender to dorsum of first MPJ and pain and crepitus noted with ROM of the first MPJ  Neurological: Sensation intact to light touch.   Assessment:   1. Hallux limitus, right      Plan:  Patient was evaluated and treated and all questions answered. -Xrays reviewed. Significant degenerativce changes and spurring noted at the first MPJ with decreased of joint space.  -Discussed hallux limitus and  treatement options; conservative and  Surgical management; risks, benefits, alternatives discussed. All patient's questions answered. -Rx Meloxicam provided. Previous CMP wnl -Defers injections today.    -Recommend continue with good supportive shoes and inserts. Discussed stiff soled shoes  -Patient to return to office as needed or sooner if condition worsens.   Louann Sjogren, DPM

## 2024-01-09 ENCOUNTER — Emergency Department (HOSPITAL_BASED_OUTPATIENT_CLINIC_OR_DEPARTMENT_OTHER)
Admission: EM | Admit: 2024-01-09 | Discharge: 2024-01-09 | Disposition: A | Discharge status: Home / Self Care | Attending: Emergency Medicine | Admitting: Emergency Medicine

## 2024-01-09 ENCOUNTER — Emergency Department (HOSPITAL_BASED_OUTPATIENT_CLINIC_OR_DEPARTMENT_OTHER)

## 2024-01-09 ENCOUNTER — Other Ambulatory Visit: Payer: Self-pay

## 2024-01-09 ENCOUNTER — Telehealth: Payer: Self-pay | Admitting: Gastroenterology

## 2024-01-09 ENCOUNTER — Other Ambulatory Visit (HOSPITAL_BASED_OUTPATIENT_CLINIC_OR_DEPARTMENT_OTHER): Payer: Self-pay

## 2024-01-09 ENCOUNTER — Encounter (HOSPITAL_BASED_OUTPATIENT_CLINIC_OR_DEPARTMENT_OTHER): Payer: Self-pay

## 2024-01-09 DIAGNOSIS — E039 Hypothyroidism, unspecified: Secondary | ICD-10-CM | POA: Diagnosis not present

## 2024-01-09 DIAGNOSIS — Z79890 Hormone replacement therapy (postmenopausal): Secondary | ICD-10-CM | POA: Diagnosis not present

## 2024-01-09 DIAGNOSIS — K5732 Diverticulitis of large intestine without perforation or abscess without bleeding: Secondary | ICD-10-CM | POA: Insufficient documentation

## 2024-01-09 DIAGNOSIS — Z79899 Other long term (current) drug therapy: Secondary | ICD-10-CM | POA: Insufficient documentation

## 2024-01-09 DIAGNOSIS — I10 Essential (primary) hypertension: Secondary | ICD-10-CM | POA: Diagnosis not present

## 2024-01-09 DIAGNOSIS — K5792 Diverticulitis of intestine, part unspecified, without perforation or abscess without bleeding: Secondary | ICD-10-CM

## 2024-01-09 DIAGNOSIS — R109 Unspecified abdominal pain: Secondary | ICD-10-CM | POA: Diagnosis present

## 2024-01-09 LAB — COMPREHENSIVE METABOLIC PANEL WITH GFR
ALT: 28 U/L (ref 0–44)
AST: 26 U/L (ref 15–41)
Albumin: 4.3 g/dL (ref 3.5–5.0)
Alkaline Phosphatase: 70 U/L (ref 38–126)
Anion gap: 13 (ref 5–15)
BUN: 18 mg/dL (ref 8–23)
CO2: 22 mmol/L (ref 22–32)
Calcium: 9.5 mg/dL (ref 8.9–10.3)
Chloride: 102 mmol/L (ref 98–111)
Creatinine, Ser: 1.1 mg/dL (ref 0.61–1.24)
GFR, Estimated: >60 mL/min (ref >60)
Glucose, Bld: 130 mg/dL — ABNORMAL HIGH (ref 70–99)
Potassium: 4 mmol/L (ref 3.5–5.1)
Sodium: 137 mmol/L (ref 135–145)
Total Bilirubin: 0.4 mg/dL (ref 0.0–1.2)
Total Protein: 8 g/dL (ref 6.5–8.1)

## 2024-01-09 LAB — CBC
HCT: 41 % (ref 39.0–52.0)
Hemoglobin: 14.2 g/dL (ref 13.0–17.0)
MCH: 32.3 pg (ref 26.0–34.0)
MCHC: 34.6 g/dL (ref 30.0–36.0)
MCV: 93.2 fL (ref 80.0–100.0)
Platelets: 329 K/uL (ref 150–400)
RBC: 4.4 MIL/uL (ref 4.22–5.81)
RDW: 12.3 % (ref 11.5–15.5)
WBC: 9.5 K/uL (ref 4.0–10.5)
nRBC: 0 % (ref 0.0–0.2)

## 2024-01-09 LAB — URINALYSIS, ROUTINE W REFLEX MICROSCOPIC
Bilirubin Urine: NEGATIVE
Glucose, UA: NEGATIVE mg/dL
Hgb urine dipstick: NEGATIVE
Ketones, ur: NEGATIVE mg/dL
Leukocytes,Ua: NEGATIVE
Nitrite: NEGATIVE
Protein, ur: NEGATIVE mg/dL
Specific Gravity, Urine: 1.017 (ref 1.005–1.030)
pH: 7 (ref 5.0–8.0)

## 2024-01-09 LAB — LIPASE, BLOOD: Lipase: 28 U/L (ref 11–51)

## 2024-01-09 MED ORDER — CIPROFLOXACIN HCL 500 MG PO TABS: 500.0000 mg | ORAL_TABLET | Freq: Two times a day (BID) | ORAL | 0 refills | Status: DC

## 2024-01-09 MED ORDER — METRONIDAZOLE 500 MG PO TABS
500.0000 mg | ORAL_TABLET | Freq: Two times a day (BID) | ORAL | 0 refills | Status: AC
  Filled 2024-01-09: qty 14, 7d supply, fill #0

## 2024-01-09 MED ORDER — SODIUM CHLORIDE 0.9 % IV SOLN
2.0000 g | Freq: Once | INTRAVENOUS | Status: AC
  Administered 2024-01-09: 2 g via INTRAVENOUS
  Filled 2024-01-09: qty 20

## 2024-01-09 MED ORDER — METRONIDAZOLE 500 MG/100ML IV SOLN
500.0000 mg | Freq: Once | INTRAVENOUS | Status: AC
  Administered 2024-01-09: 500 mg via INTRAVENOUS
  Filled 2024-01-09: qty 100

## 2024-01-09 MED ORDER — HYDROCODONE-ACETAMINOPHEN 5-325 MG PO TABS: 1.0000 | ORAL_TABLET | Freq: Four times a day (QID) | ORAL | 0 refills | Status: DC | PRN

## 2024-01-09 MED ORDER — ONDANSETRON 4 MG PO TBDP
4.0000 mg | ORAL_TABLET | Freq: Three times a day (TID) | ORAL | 0 refills | Status: AC | PRN
  Filled 2024-01-09: qty 20, 7d supply, fill #0

## 2024-01-09 MED ORDER — METRONIDAZOLE 500 MG PO TABS: 500.0000 mg | ORAL_TABLET | Freq: Two times a day (BID) | ORAL | 0 refills | Status: DC

## 2024-01-09 MED ORDER — HYDROCODONE-ACETAMINOPHEN 5-325 MG PO TABS
1.0000 | ORAL_TABLET | Freq: Once | ORAL | Status: AC
  Administered 2024-01-09: 1 via ORAL
  Filled 2024-01-09: qty 1

## 2024-01-09 MED ORDER — ONDANSETRON 4 MG PO TBDP: 4.0000 mg | ORAL_TABLET | Freq: Three times a day (TID) | ORAL | 0 refills | Status: DC | PRN

## 2024-01-09 MED ORDER — IOHEXOL 300 MG/ML  SOLN
100.0000 mL | Freq: Once | INTRAMUSCULAR | Status: AC | PRN
  Administered 2024-01-09: 100 mL via INTRAVENOUS

## 2024-01-09 MED ORDER — CIPROFLOXACIN HCL 500 MG PO TABS
500.0000 mg | ORAL_TABLET | Freq: Two times a day (BID) | ORAL | 0 refills | Status: AC
Start: 2024-01-09 — End: 2024-01-16
  Filled 2024-01-09: qty 14, 7d supply, fill #0

## 2024-01-09 MED ORDER — HYDROCODONE-ACETAMINOPHEN 5-325 MG PO TABS
1.0000 | ORAL_TABLET | Freq: Four times a day (QID) | ORAL | 0 refills | Status: AC | PRN
  Filled 2024-01-09: qty 7, 2d supply, fill #0

## 2024-01-09 NOTE — ED Triage Notes (Signed)
 Pt reports:  Abdomen pain Ongoing for 1 week Dx with diverticulitis

## 2024-01-09 NOTE — Telephone Encounter (Signed)
 Patient called and stated that he has a diverticulitis flare up and has noticed that when ever he has a flare up his big toe will swell up and was wanting what the correlation was. Patient is requesting a call back. Please advise.

## 2024-01-09 NOTE — ED Notes (Signed)
 DC paperwork given and verbally understood.

## 2024-01-09 NOTE — Telephone Encounter (Signed)
 The pt states he is on the phone with his PCP and will call back if he needs us .  I did mention he should make a follow up with a provider he chooses since Dr Eda is no longer here.  The pt has been advised of the information and verbalized understanding.

## 2024-01-09 NOTE — ED Provider Notes (Signed)
 [place] EMERGENCY DEPARTMENT AT [facility] Provider Note   CSN: [id number] Arrival date & time: 01/09/24  1431     Patient presents with: Abdominal Pain   Brad Flores is a 68 y.o. male.  With past medical history of hypertension, hyperlipidemia, subclinical hypothyroidism, diverticulitis presented to emergency room with complaint of abdominal pain.  Patient reports abdominal pain has been primarily left lower quadrant ongoing for about 8 days.  Has not tried clear liquid diet or any type of treatment for this at home.  Denies fever.  Denies nausea vomiting diarrhea.  Reports last bowel movement was 2 days ago.  He has been passing gas.  He has been tolerating intake.  Reports this feels like typical diverticulitis that he has had in the past.  Also notes he has started having primarily left great toe pain redness and warmth that started last night.  Has history of similar and presumably diagnosed as gout.    Abdominal Pain      Prior to Admission medications   Medication Sig Start Date End Date Taking? Authorizing Provider  atorvastatin  (LIPITOR) 80 MG tablet Take 1 tablet (80 mg total) by mouth daily. 12/09/20   Melvin Pao, NP  indomethacin (INDOCIN SR) 75 MG CR capsule Take by mouth. 09/16/18   [provider]  levothyroxine  (SYNTHROID ) 25 MCG tablet TAKE 1 TABLET BY MOUTH EVERY DAY 05/04/22   Melvin Pao, NP  lisinopril  (ZESTRIL ) 40 MG tablet TAKE 1 TABLET BY MOUTH EVERY DAY 04/09/22   Melvin Pao, NP  meloxicam  (MOBIC ) 15 MG tablet Take 1 tablet (15 mg total) by mouth daily. 08/12/23   Sikora, Rebecca, DPM    Allergies: Patient has no known allergies.    Review of Systems  Gastrointestinal:  Positive for abdominal pain.    Updated Vital Signs BP (!) 150/92 (BP Location: Right Arm)   Pulse 75   Temp 97.9 F (36.6 C) (Oral)   Resp 18   Ht 5' 7 (1.702 m)   Wt 86.2 kg   SpO2 96%   BMI 29.76 kg/m   Physical Exam Vitals and  nursing note reviewed.  Constitutional:      General: He is not in acute distress.    Appearance: He is not toxic-appearing.  HENT:     Head: Normocephalic and atraumatic.  Eyes:     General: No scleral icterus.    Conjunctiva/sclera: Conjunctivae normal.  Cardiovascular:     Rate and Rhythm: Normal rate and regular rhythm.     Pulses: Normal pulses.     Heart sounds: Normal heart sounds.  Pulmonary:     Effort: Pulmonary effort is normal. No respiratory distress.     Breath sounds: Normal breath sounds.  Abdominal:     General: Abdomen is flat. Bowel sounds are normal.     Palpations: Abdomen is soft.     Tenderness: There is abdominal tenderness in the left lower quadrant.  Skin:    General: Skin is warm and dry.     Findings: No lesion.  Neurological:     General: No focal deficit present.     Mental Status: He is alert and oriented to person, place, and time. Mental status is at baseline.     (all labs ordered are listed, but only abnormal results are displayed) Labs Reviewed  COMPREHENSIVE METABOLIC PANEL WITH GFR - Abnormal; Notable for the following components:      Result Value   Glucose, Bld 130 (*)  All other components within normal limits  LIPASE, BLOOD  CBC  URINALYSIS, ROUTINE W REFLEX MICROSCOPIC    EKG: None  Radiology: CT ABDOMEN PELVIS W CONTRAST Result Date: 01/09/2024 CLINICAL DATA:  Left lower quadrant pain for 1 week EXAM: CT ABDOMEN AND PELVIS WITH CONTRAST TECHNIQUE: Multidetector CT imaging of the abdomen and pelvis was performed using the standard protocol following bolus administration of intravenous contrast. RADIATION DOSE REDUCTION: This exam was performed according to the departmental dose-optimization program which includes automated exposure control, adjustment of the mA and/or kV according to patient size and/or use of iterative reconstruction technique. CONTRAST:  OMNIPAQUE  IOHEXOL  300 MG/ML  SOLN COMPARISON:  CT abdomen pelvis  April 26, 2022 FINDINGS: Lower chest: No acute abnormality. Hepatobiliary: Mild hepatic steatosis. No focal liver abnormality is seen. No gallstones, gallbladder wall thickening, or biliary dilatation. Pancreas: Unremarkable. No pancreatic ductal dilatation or surrounding inflammatory changes. Spleen: Normal in size without focal abnormality. Adrenals/Urinary Tract: Adrenal glands are unremarkable. Kidneys are normal, without renal calculi, focal lesion, or hydronephrosis. Bladder is unremarkable. Stomach/Bowel: Stomach is within normal limits. Colonic diverticulosis at least 3 sites of inflammation, circumferential enhancement and wall thickening and pericolonic fat stranding suggestive of diverticulitis along the colon involving the hepatic flexure of the colon (5/75), junction of the sigmoid to descending colon (most severe and similar in location to 2023) (5/69), and proximal - mid sigmoid colon (5/52). No definite pericolonic abscess formation. No free fluid. No bowel obstruction. Small bowel loops appear unremarkable. Appendix appears normal. No evidence of bowel wall thickening, distention, or inflammatory changes. Vascular/Lymphatic: No significant vascular findings atherosclerotic calcifications of aorta. No enlarged abdominal or pelvic lymph nodes. Reproductive: Normal size prostate. Other: Direct and indirect left inguinal fat containing hernia(Pantaloon type). Right-sided small fat containing indirect inguinal hernia. (3/76). Musculoskeletal: Degenerative changes of bilateral hip joints and subcortical geodes. Multilevel degenerative changes of the spine and disc disease. Levoconvex scoliosis. IMPRESSION: Diffuse colonic diverticulosis. Multifocal inflammatory changes suggestive of diverticulitis (at least 3 sites) , more severe at the junction of sigmoid and descending colon, as detail above. Recommend follow-up to ensure complete resolution and to exclude underlying malignancy. Bilateral fat  containing inguinal hernias (Pantaloon type on the left). Hepatic steatosis. Electronically Signed   By: Megan  Zare M.D.   On: 01/09/2024 17:39     Procedures   Medications Ordered in the ED  cefTRIAXone  (ROCEPHIN ) 2 g in sodium chloride  0.9 % 100 mL IVPB (has no administration in time range)    And  metroNIDAZOLE  (FLAGYL ) IVPB 500 mg (has no administration in time range)  iohexol  (OMNIPAQUE ) 300 MG/ML solution 100 mL (100 mLs Intravenous Contrast Given 01/09/24 1638)                                    Medical Decision Making Amount and/or Complexity of Data Reviewed Labs: ordered. Radiology: ordered.  Risk Prescription drug management.   This patient presents to the ED for concern of abdominal pain, this involves an extensive number of treatment options, and is a complaint that carries with it a high risk of complications and morbidity.  The differential diagnosis includes diverticulitis, bowel obstruction, abdominal abscess, appendicitis   Co morbidities that complicate the patient evaluation  Hypertension, hyperlipidemia, history of diverticulitis   Additional history obtained:  Additional history obtained from had colonoscopy 08/14/2022 which showed diverticulosis as well as small polyps.   Lab Tests:  I  personally interpreted labs.  The pertinent results include:   No leukocytosis.  Normal kidney and liver function.  Normal UA   Imaging Studies ordered:  I ordered imaging studies including the scan of the abdomen and pelvis I independently visualized and interpreted imaging which showed diverticulitis.  No abscess or perforation. I agree with the radiologist interpretation   Cardiac Monitoring: / EKG:  The patient was maintained on a cardiac monitor.     Problem List / ED Course / Critical interventions / Medication management  Is reporting to the emergency room with complaint of abdominal pain specifically located in left lower quadrant.  On my exam he  has significant tenderness to this quadrant.  No rebound tenderness.  His vital signs are stable and he is overall well-appearing.  He is tolerating oral intake.  No sign of systemic illness.  No fever.  Overall reassuring lab work but given duration of symptoms we will obtain CT scan to rule out complicated diverticulitis or other acute findings.  Also noting left great toe pain with pain and redness.  No severe swelling to the area and no pain with range of motion.  He is neurovascularly intact and has no noted injury.  He has history of gout and feels that this is quite similar.  Reports he is primarily here for abdominal pain, I do not feel imaging is necessary at this time which she agrees with. CT scan shows diverticulitis.  Patient has normal vital signs, afebrile.  Has normal WBC count.  Pain is well-controlled without any medicine here.  Not having any nausea or vomiting.  Patient is tolerating p.o. and symptoms are well-controlled with no sign of systemic illness at this feel patient is appropriate for trial of outpatient antibiotics. Patient is already established with GI and will call for follow-up as well as colonoscopy.  Even strict return precautions if things were to worsen he will return. Patient declined pain medicine or nausea medicine at this time. I have reviewed the patients home medicines and have made adjustments as needed      Final diagnoses:  Diverticulitis    ED Discharge Orders          Ordered    ondansetron  (ZOFRAN -ODT) 4 MG disintegrating tablet  Every 8 hours PRN        01/09/24 1746    HYDROcodone -acetaminophen  (NORCO/VICODIN) 5-325 MG tablet  Every 6 hours PRN        01/09/24 1746    metroNIDAZOLE  (FLAGYL ) 500 MG tablet  2 times daily        01/09/24 1746    ciprofloxacin  (CIPRO ) 500 MG tablet  Every 12 hours        01/09/24 1746               Lakeia Bradshaw, Warren SAILOR, PA-C 01/09/24 1750    Patsey Lot, MD 01/10/24 1456

## 2024-01-09 NOTE — Discharge Instructions (Addendum)
 Try clear liquid diet for 2 days and slowly progress into bland, low fiber foods like applesauce banana toast for 2 days before slowly going back to normal diet.  Take antibiotic as prescribed.  Take Zofran  as needed for nausea vomiting.  Take Tylenol  or Norco for pain.  You need to follow-up with GI and consider repeat colonoscopy after treatment.  Return to emergency room with new or worsening symptoms.

## 2024-02-26 ENCOUNTER — Encounter: Payer: Self-pay | Admitting: Gastroenterology

## 2024-02-26 ENCOUNTER — Ambulatory Visit: Admitting: Gastroenterology

## 2024-02-26 VITALS — BP 126/84 | HR 60 | Ht 67.0 in | Wt 198.0 lb

## 2024-02-26 DIAGNOSIS — K5792 Diverticulitis of intestine, part unspecified, without perforation or abscess without bleeding: Secondary | ICD-10-CM | POA: Diagnosis not present

## 2024-02-26 DIAGNOSIS — Z860101 Personal history of adenomatous and serrated colon polyps: Secondary | ICD-10-CM

## 2024-02-26 NOTE — Patient Instructions (Signed)
 Follow-up as needed.   You will be due for a recall colonoscopy in 2027. We will send you a reminder in the mail when it gets closer to that time.  _______________________________________________________  If your blood pressure at your visit was 140/90 or greater, please contact your primary care physician to follow up on this.  _______________________________________________________  If you are age 69 or older, your body mass index should be between 23-30. Your Body mass index is 31.01 kg/m. If this is out of the aforementioned range listed, please consider follow up with your Primary Care Provider.  If you are age 46 or younger, your body mass index should be between 19-25. Your Body mass index is 31.01 kg/m. If this is out of the aformentioned range listed, please consider follow up with your Primary Care Provider.   ________________________________________________________  The [facility] providers would like to encourage you to use MYCHART to communicate with providers for non-urgent requests or questions.  Due to long hold times on the telephone, sending your provider a message by [facility] may be a faster and more efficient way to get a response.  Please allow 48 business hours for a response.  Please remember that this is for non-urgent requests.  _______________________________________________________  [facility] is using a team-based approach to care.  Your team is made up of your doctor and two to three APPS. Our APPS (Nurse Practitioners and Physician Assistants) work with your physician to ensure care continuity for you. They are fully qualified to address your health concerns and develop a treatment plan. They communicate directly with your gastroenterologist to care for you. Seeing the Advanced Practice Practitioners on your physician's team can help you by facilitating care more promptly, often allowing for earlier appointments, access to diagnostic testing,  procedures, and other specialty referrals.   Thank you for choosing me and [facility].   Jessica Zehr, PA-C

## 2024-02-26 NOTE — Progress Notes (Addendum)
 ____________________________________________________________  Attending physician addendum:  Thank you for sending this case to me. I have reviewed the entire note and agree with the plan.  My recommendation is a 3-year surveillance interval from his last colonoscopy.  Victory Brand, MD  ____________________________________________________________

## 2024-02-26 NOTE — Progress Notes (Signed)
 02/26/2024 Brad Flores [id number] [birth year]   HISTORY OF PRESENT ILLNESS: This is a 68 year old male who was previously patient of Dr. Eda, his care is being assigned to Dr. Legrand.  He has had issues with diverticulitis over the years.  Had CT scan in 2023 that showed diverticulitis and describes having episodes of diverticulitis intermittently over the past probably 10 years or so.  Recently had left-sided abdominal pain back in August.  Went to the emergency department and they performed a CT scan as below that showed diverticulitis.  Given a dose of IV Cipro  and Flagyl  and then prescribed Cipro  and Flagyl  to complete oral course.  At this point he is feeling well.  No residual pain.  CT scan of the abdomen and pelvis with contrast 12/2023:  IMPRESSION: Diffuse colonic diverticulosis. Multifocal inflammatory changes suggestive of diverticulitis (at least 3 sites) , more severe at the junction of sigmoid and descending colon, as detail above. Recommend follow-up to ensure complete resolution and to exclude underlying malignancy.   Bilateral fat containing inguinal hernias (Pantaloon type on the left).   Hepatic steatosis.   Was treated with Cipro  and flagyl  both twice daily for 7 days.   Colonoscopy 07/2022:  - Diverticulosis in the entire examined colon. - Two 2 to 3 mm polyps in the descending colon and in the ascending colon, removed with a cold snare. Resected and retrieved. - Two 2 to 3 mm polyps in the cecum, removed with a cold snare. Resected and retrieved. - The examination was otherwise normal on direct and retroflexion views.  Surgical [P], colon, ascending and cecum, descending, polyp (4) - TUBULAR ADENOMA(S). - NO HIGH GRADE DYSPLASIA OR MALIGNANCY. - SEPARATE POLYPOID FRAGMENTS OF BENIGN COLONIC MUCOSA WITH LYMPHOID AGGREGATE   Past Medical History:  Diagnosis Date   COVID-19 11/2019   Hyperlipidemia    Hypertension    Hypothyroid    Past  Surgical History:  Procedure Laterality Date   ACHILLES TENDON LENGTHENING     APPENDECTOMY     CARPAL TUNNEL RELEASE Right    HAND TENDON SURGERY     HERNIA REPAIR     KNEE SURGERY     ROTATOR CUFF REPAIR      reports that he has never smoked. He has never used smokeless tobacco. He reports current alcohol use. He reports that he does not use drugs. family history includes Diabetes in his mother; Heart disease in his father and mother. No Known Allergies    Outpatient Encounter Medications as of 02/26/2024  Medication Sig   HYDROcodone -acetaminophen  (NORCO/VICODIN) 5-325 MG tablet Take 1 tablet by mouth every 6 (six) hours as needed for severe pain (pain score 7-10).   indomethacin (INDOCIN SR) 75 MG CR capsule Take by mouth.   levothyroxine  (SYNTHROID ) 25 MCG tablet TAKE 1 TABLET BY MOUTH EVERY DAY   lisinopril  (ZESTRIL ) 40 MG tablet TAKE 1 TABLET BY MOUTH EVERY DAY   meloxicam  (MOBIC ) 15 MG tablet Take 1 tablet (15 mg total) by mouth daily.   metroNIDAZOLE  (FLAGYL ) 500 MG tablet Take 1 tablet (500 mg total) by mouth 2 (two) times daily.   ondansetron  (ZOFRAN -ODT) 4 MG disintegrating tablet Take 1 tablet (4 mg total) by mouth every 8 (eight) hours as needed for nausea or vomiting.   atorvastatin  (LIPITOR) 80 MG tablet Take 1 tablet (80 mg total) by mouth daily. (Patient not taking: Reported on 02/26/2024)   No facility-administered encounter medications on file as of 02/26/2024.  REVIEW OF SYSTEMS  : All other systems reviewed and negative except where noted in the History of Present Illness.   PHYSICAL EXAM: BP 126/84 (BP Location: Left Arm, Patient Position: Sitting, Cuff Size: Normal)   Pulse 60   Ht 5' 7 (1.702 m)   Wt 198 lb (89.8 kg)   SpO2 96%   BMI 31.01 kg/m  General: Well developed white male in no acute distress Head: Normocephalic and atraumatic Eyes:  Sclerae anicteric, conjunctiva pink. Ears: Normal auditory acuity Lungs: Clear throughout to auscultation;  no W/R/R. Heart: Regular rate and rhythm; no M/R/G. Abdomen: Soft, non-distended.  BS present.  Non-tender. Musculoskeletal: Symmetrical with no gross deformities  Skin: No lesions on visible extremities Extremities: No edema  Neurological: Alert oriented x 4, grossly non-focal Psychological:  Alert and cooperative. Normal mood and affect  ASSESSMENT AND PLAN: *Diverticulitis: Has had diverticulitis in the past.  Last month had left lower quadrant abdominal pain and CT scan confirmed the same.  Was treated with 7 days of Cipro  and Flagyl .  Pain resolved.  Will follow-up on an as needed basis.  Will contact us  for any recurrent similar symptoms at which time we could consider treating empirically based on his history.  Advised him to be sure he is drinking plenty of fluids, eating plenty of fiber to keep stools soft, etc.   CC:  Campbell Reynolds, NP

## 2024-03-10 ENCOUNTER — Telehealth: Payer: Self-pay | Admitting: Gastroenterology

## 2024-03-10 MED ORDER — AMOXICILLIN-POT CLAVULANATE 875-125 MG PO TABS: 1.0000 | ORAL_TABLET | Freq: Two times a day (BID) | ORAL | 0 refills | Status: AC

## 2024-03-10 NOTE — Telephone Encounter (Signed)
 Inbound call from patient stating he was told at last office visit on 02/26/24 to reach out if needed refill on antibiotics for diverticulitis flare up. Patient is stating he needs refill and would like to be notified when it is sent to pharmacy.  Please advise  Thank you

## 2024-03-10 NOTE — Telephone Encounter (Signed)
 The pt has been advised and prescription sent. We discussed the referral to surgeon but her prefers to think about it and call back if he decides to go that route.

## 2024-03-10 NOTE — Telephone Encounter (Signed)
 The pt has a history of diverticulitis and has a flare with left lower abd pain and was told that he should call if this recurs.  This episode started overnight and has worsened thru the day.  He would like abx called in. Please advise
